# Patient Record
Sex: Female | Born: 1960 | Race: White | Hispanic: No | Marital: Married | Smoking: Never smoker
Health system: Southern US, Community
[De-identification: ages and names within clinical notes are randomized; demographics above are authoritative.]

## PROBLEM LIST (undated history)

## (undated) DIAGNOSIS — M1990 Unspecified osteoarthritis, unspecified site: Secondary | ICD-10-CM

## (undated) DIAGNOSIS — R011 Cardiac murmur, unspecified: Secondary | ICD-10-CM

## (undated) DIAGNOSIS — T7840XA Allergy, unspecified, initial encounter: Secondary | ICD-10-CM

## (undated) HISTORY — DX: Unspecified osteoarthritis, unspecified site: M19.90

## (undated) HISTORY — DX: Allergy, unspecified, initial encounter: T78.40XA

## (undated) HISTORY — DX: Cardiac murmur, unspecified: R01.1

## (undated) HISTORY — PX: KNEE ARTHROSCOPY WITH ANTERIOR CRUCIATE LIGAMENT (ACL) REPAIR: SHX5644

## (undated) HISTORY — PX: KNEE CARTILAGE SURGERY: SHX688

---

## 2001-04-30 ENCOUNTER — Ambulatory Visit (HOSPITAL_COMMUNITY): Admission: RE | Admit: 2001-04-30 | Discharge: 2001-04-30 | Payer: Self-pay | Admitting: Specialist

## 2001-04-30 ENCOUNTER — Encounter: Payer: Self-pay | Admitting: Specialist

## 2001-07-20 ENCOUNTER — Observation Stay (HOSPITAL_COMMUNITY): Admission: RE | Admit: 2001-07-20 | Discharge: 2001-07-21 | Payer: Self-pay | Admitting: Orthopedic Surgery

## 2001-07-20 ENCOUNTER — Encounter: Payer: Self-pay | Admitting: Orthopedic Surgery

## 2001-09-19 ENCOUNTER — Other Ambulatory Visit: Admission: RE | Admit: 2001-09-19 | Discharge: 2001-09-19 | Payer: Self-pay | Admitting: Obstetrics and Gynecology

## 2002-11-18 ENCOUNTER — Other Ambulatory Visit: Admission: RE | Admit: 2002-11-18 | Discharge: 2002-11-18 | Payer: Self-pay | Admitting: Obstetrics and Gynecology

## 2003-01-13 ENCOUNTER — Ambulatory Visit (HOSPITAL_COMMUNITY): Admission: RE | Admit: 2003-01-13 | Discharge: 2003-01-13 | Payer: Self-pay | Admitting: Obstetrics and Gynecology

## 2003-01-13 ENCOUNTER — Encounter: Payer: Self-pay | Admitting: Obstetrics and Gynecology

## 2004-04-29 ENCOUNTER — Ambulatory Visit (HOSPITAL_COMMUNITY): Admission: RE | Admit: 2004-04-29 | Discharge: 2004-04-29 | Payer: Self-pay | Admitting: Family Medicine

## 2006-08-26 ENCOUNTER — Emergency Department (HOSPITAL_COMMUNITY): Admission: EM | Admit: 2006-08-26 | Discharge: 2006-08-26 | Payer: Self-pay | Admitting: Family Medicine

## 2009-12-24 ENCOUNTER — Encounter: Admission: RE | Admit: 2009-12-24 | Discharge: 2009-12-24 | Payer: Self-pay | Admitting: Obstetrics and Gynecology

## 2010-08-30 ENCOUNTER — Ambulatory Visit (HOSPITAL_COMMUNITY): Payer: Self-pay

## 2010-11-12 NOTE — Op Note (Signed)
[facility]  Patient:    Veronica Chavez, Veronica Chavez Visit Number: [id number] MRN: [id number]          Service Type: SUR Location: 4W [id number] 01 Attending Physician:  Verlee Rossetti. Dictated by:   Malon Kindle, M.D. Proc. Date: 07/20/01 Admit Date:  07/20/2001 Discharge Date: 07/21/2001                             Operative Report  PREOPERATIVE DIAGNOSIS:  Left anterior cruciate ligament tear.  POSTOPERATIVE DIAGNOSES:  1. Left anterior cruciate ligament tear.  2. Medial meniscal tear and medial femoral condyle chondromalacia.  PROCEDURE PERFORMED:  Left knee bone ______ bone autograft ACL reconstruction.  ATTENDING SURGEON:  Malon Kindle, M.D.  FIRST ASSISTANT:  Ralene Bathe, P.A.  SECOND ASSISTANT:  Javier Docker, M.D.  ANESTHESIA:  General.  ESTIMATED BLOOD LOSS:  Minimal.  TOURNIQUET TIME:  Two hours.  INSTRUMENT COUNTS:  Correct.  COMPLICATIONS:  None.  Preoperative antibiotics were given. Fluid replacement was 1200 cc crystalloid.  INDICATIONS FOR PROCEDURE:  The patient is a 50 year old female who complained of a noncontact twisting injury to the left knee. The patients knee gave way. The patient complained of swelling and pain in the knee. He presented at orthopedics with an unstable knee and had an MRI scan demonstrating ACL disruption as well as medial meniscal tear and medial femoral condylar cartilage damage and a lateral bone bruise. The patient rehabbed her knee and continued to have functional instability with activities of daily living. After discussion with the patient options of management including nonoperative treatment, physical therapy, activity modifications versus surgical reconstruction of her ACL, she elected to proceed with ACL reconstruction. Informed consent was signed on the chart.  DESCRIPTION OF PROCEDURE:  After an adequate level of general anesthesia was achieved and 1 gm of Ancef was given  preoperatively, the patient was positioned supine on the operating room table. Examination under anesthesia was performed on the left knee. This revealed range of motion from 0 to 140 degrees. She had a 2+ anterior drawer and 2+ Lachman negative posterior drawer, positive pivot shift, stable varus and valgus stress, negative posterolateral rotatory instability. After a completion of the EUA, a nonsterile tourniquet was placed on the proximal thigh, the left leg was then placed in an arthroscopic leg holder. The right leg was placed in a well leg holder. The left leg was exsanguinated using an Esmarch bandage and tourniquet elevated to 275 mmHg. A longitudinal incision was created over the patellar tendon utilizing a 10 blade scalpel going down toward the medial tibial tubercle. Dissection was carried down through the subcutaneous tissues. The peritenon was identified and incised in line with the middle of the patella tendon. A central third of the patella tendon was harvested using a 10 blade scalpel including bone plugs from the patella and the tibia. These were 10 mm bone plugs, 20-25 mm in length. A graft was taken to the back table and prepared and sized to fit in 10 mm tunnels. A patellar plug was 25 mm in length and would be used for the femur. Two #5 Ethibond sutures were placed through drill holes in that plug and the tibial plug was 30 mm in length and ear marked for the tibial plug. The total graft length was 90 mm. After three #5 Ethibonds are placed in the tibial plug, the graft was then placed in a moist sponge and held on the back  table for later in the case. A superolateral outflow portal was created using an 11 blade scalpel and introduction of the cannula in the joint using a blunt obturator and the lateral and anterior medial portals were created through the midline incision utilizing an 11 blade scalpel and hemostat. The knee was scope, there was noted to be  some chondromalacia in the patellofemoral joint which was grade 1 and grade 2. The medial and lateral gutters were free of loose bodies. The lateral compartment was entered and there was noted to be no evidence of meniscal tear or cartilage injury to the femoral condyle or tibial plateau. The ACL was notably torn with a large stump present in the notch. PCL was intact. The medial compartment was entered and there was noted to be a posterior horn medial meniscal tear. This appeared to be a 1 1/2 to 2 cm tear posteriorly. Initially it was felt like this tear was possibly repairable. It was right near the white white red white junction. An attempt was made to repair the meniscus with two meniscal darts by arthrex. These were placed into appropriate position on the femoral side of the meniscus and after placement of the arrows, a probe was used to probe the meniscus and there was noted to be laxity of the meniscus with under surface probing. It was decided at this point because of persistent instability despite appropriate arrow placement to proceed with partial meniscectomy. This was performed. It was noted at the time of partial meniscectomy that this tear pattern was more of a complex pattern with horizontal cleavage component as well as the longitudinal component. The darts were noted to be counter sunk into good meniscal tissue posteriorly and these were left in place. There were no prominent heads visualized. Approximately 15-20% of the meniscus had to be removed during this portion of the procedure. The remainder of the meniscus looked entirely normal. The medial femoral condyle had some grade 2 and grade 3 changes which loose cartilage fraying was removed using a forward edge resector. At this point, soft tissue was removed from the notch and a notchplasty is performed using a motorized bur. The Linvatec tibial guide was then placed in the knee approximately 7 mm anterior to the  posterior cruciate ligament just medial to the midline and a 10 mm tunnel was drilled at 45 degrees starting medial and  distal to the tibial tubercle. The soft tissue was removed from the tunnel and the posterior aspect of the tunnel was rasped. At this point, a 6 mm offset ACL guide by Arthrex was placed in the 1 to 115 position in this left knee. The knee was placed at about 85 degrees and a Beath pin was drilled out the anterior lateral thigh. The position of the Beath pin was verified and a 10 mm acorn drill bit was introduced into the knee and used to drill a tunnel in the femur. The presence of a back wall was verified prior to fully drilling the tunnel. It was drilled to a depth of 30 mm. Soft tissue and loose debris was removed from the knee. An extra graft was placed in antegrade manner using the Beath pin. The anterior aspect of the femoral tunnel notched and an 8 x 25 mm arthrex titanium interference screw was placed adjacent to the graft gaining adequate purchase. The knee was then cycled approximately 20-30 times to verify stability of the femoral plug and to pretension the graft. Excellent isometry was noted during full flexion  extension with absolutely no movement of the tibial plug within the tibial tunnel. At this point, the knee was placed in near full extension, the posterior drawer was applied and tension on the graft and a 9 x 25 mm arthrex titanium interference screw was placed adjacent to cancellous bone on the tibial side. The knee was then examined for stability and there was noted to be negative Lachman and negative anterior drawer. The patellar defect was grafted using available bone graft and then the peritenon was closed using 2-0 running Vicryl. The patellar tendon had been reapproximated in the anterior fibers only using 2-0 Vicryl simple interrupted and then subcu was closed using 2-0 Vicryl and the skin using 4-0 running monocryl. The superolateral portal  was closed using the monocryl as well. A sterile dressing was applied followed by a knee immobilizer. The patient tolerated the surgery well and was taken to the recovery room in stable condition. Dictated by:   Malon Kindle, M.D. Attending Physician:  Malon Kindle R. DD:  07/21/01 TD:  07/21/01 Job: [id number]

## 2012-06-22 ENCOUNTER — Ambulatory Visit (INDEPENDENT_AMBULATORY_CARE_PROVIDER_SITE_OTHER): Payer: BC Managed Care – PPO | Admitting: Family Medicine

## 2012-06-22 VITALS — BP 125/71 | HR 67 | Temp 98.4°F | Resp 16 | Ht 62.0 in | Wt 112.0 lb

## 2012-06-22 DIAGNOSIS — M1990 Unspecified osteoarthritis, unspecified site: Secondary | ICD-10-CM

## 2012-06-22 DIAGNOSIS — M542 Cervicalgia: Secondary | ICD-10-CM

## 2012-06-22 DIAGNOSIS — Z0000 Encounter for general adult medical examination without abnormal findings: Secondary | ICD-10-CM

## 2012-06-22 LAB — POCT CBC
Granulocyte percent: 48.8 %G (ref 37–80)
HCT, POC: 44.4 % (ref 37.7–47.9)
Hemoglobin: 14.5 g/dL (ref 12.2–16.2)
POC Granulocyte: 2.3 (ref 2–6.9)
POC LYMPH PERCENT: 37.3 %L (ref 10–50)
RBC: 4.78 M/uL (ref 4.04–5.48)
RDW, POC: 12.4 %

## 2012-06-22 LAB — LIPID PANEL
LDL Cholesterol: 106 mg/dL — ABNORMAL HIGH (ref 0–99)
Triglycerides: 78 mg/dL (ref <150)

## 2012-06-22 LAB — COMPREHENSIVE METABOLIC PANEL
Albumin: 4.6 g/dL (ref 3.5–5.2)
Alkaline Phosphatase: 56 U/L (ref 39–117)
Chloride: 102 mEq/L (ref 96–112)
Glucose, Bld: 82 mg/dL (ref 70–99)
Potassium: 4.2 mEq/L (ref 3.5–5.3)
Sodium: 139 mEq/L (ref 135–145)
Total Protein: 6.8 g/dL (ref 6.0–8.3)

## 2012-06-22 LAB — TSH: TSH: 1.581 u[IU]/mL (ref 0.350–4.500)

## 2012-06-22 NOTE — Patient Instructions (Addendum)
Get a colonoscopy(51 year old) at [facility] [phone]) or Dr. Loreta Ave or Dr. Elnoria Howard [phone])  Continue following with the GYN for annual breast /pelvic exams  Return if worse

## 2012-06-22 NOTE — Progress Notes (Signed)
Complete physical examination  History: Patient is here for physical exam. She has no major acute medical complaints. She has a regular Dr. but was unable to get an appointment in for her annual physical which is needed by her insurance. It has been a long time since she had a set of lab work done she would like that.  Past medical history. Medical illnesses: History of respiratory allergies, have improved with the years Arthritis, most of the hands and knees History of heart murmur when she was younger History of ADHD  Surgical history: Has had surgery both knees. She had a fracture of the right a an anterior cruciate ligament tear on the left.  Allergies: Codeine  Current medications: Adderall XR 30 mg which she's been on for about 10 years. She is also on some supplements that she takes her.  Family history: Mother is deceased from liver problems. Father deceased from heart problems. Sister deceased from small cell lung cancer. 2 sisters are living one brother is living. One brother is deceased. She has one adopted daughter from [place] who is 34 years old.  Social history: Months sexual partner. She's been with for some time. She does not smoke. Does drink occasional beer. No illicit drug use. 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 She does exercise.  Review of systems: Constitutional unremarkable Cardiovascular unremarkable Respiratory: Unremarkable GI: GU: Unremarkable Musculoskeletal: Has joint pains as noted above. Dermatologic: Unremarkable Neurologic: Numbness to right or neck. She see her primary care about this already. She is interested in seeing a specialist Hematologic unremarkable psychiatric: The ADHD is noted Endocrine as a problems. Sees a gynecologist.  Physical examination: Gen. a well-developed well-nourished female no acute distress. TMs normal. Eyes PERRLA. Fundi  benign. Does say she has lots of floaters. Throat was clear. Neck supple without nodes thyromegaly. No carotid bruits. Chest clear to auscultation. Heart regular without murmurs gallops or arrhythmias. Abdomen soft. No nodes were noted. Extremities unremarkable. Straight leg raise test negative. Has decreased range of motion of the neck, especially side to side tilt and posterior extension. Grip is good. Assessment:  Assessment: Complete physical examination Cervical pain with radiculopathy Osteoarthritis ADHD  Plan: Refer for 51 year old colonoscopy. Refer to Ortho back to evaluate her neck. Check baseline labs

## 2012-06-24 ENCOUNTER — Encounter: Payer: Self-pay | Admitting: *Deleted

## 2013-03-02 ENCOUNTER — Inpatient Hospital Stay (HOSPITAL_COMMUNITY)
Admission: EM | Admit: 2013-03-02 | Discharge: 2013-03-05 | DRG: 482 | Disposition: A | Discharge status: Home Health | Payer: 59 | Attending: Orthopedic Surgery | Admitting: Orthopedic Surgery

## 2013-03-02 ENCOUNTER — Encounter (HOSPITAL_COMMUNITY): Payer: Self-pay | Admitting: *Deleted

## 2013-03-02 ENCOUNTER — Emergency Department (HOSPITAL_COMMUNITY): Payer: 59

## 2013-03-02 DIAGNOSIS — S72002A Fracture of unspecified part of neck of left femur, initial encounter for closed fracture: Secondary | ICD-10-CM

## 2013-03-02 DIAGNOSIS — S7223XA Displaced subtrochanteric fracture of unspecified femur, initial encounter for closed fracture: Principal | ICD-10-CM | POA: Diagnosis present

## 2013-03-02 DIAGNOSIS — Z79899 Other long term (current) drug therapy: Secondary | ICD-10-CM

## 2013-03-02 DIAGNOSIS — W11XXXA Fall on and from ladder, initial encounter: Secondary | ICD-10-CM | POA: Diagnosis present

## 2013-03-02 LAB — CBC WITH DIFFERENTIAL/PLATELET
Basophils Absolute: 0 10*3/uL (ref 0.0–0.1)
Basophils Relative: 0 % (ref 0–1)
Eosinophils Absolute: 0.1 10*3/uL (ref 0.0–0.7)
Eosinophils Relative: 1 % (ref 0–5)
HCT: 35.6 % — ABNORMAL LOW (ref 36.0–46.0)
Hemoglobin: 12 g/dL (ref 12.0–15.0)
Lymphocytes Relative: 7 % — ABNORMAL LOW (ref 12–46)
Lymphs Abs: 0.6 10*3/uL — ABNORMAL LOW (ref 0.7–4.0)
MCH: 29.3 pg (ref 26.0–34.0)
MCHC: 33.7 g/dL (ref 30.0–36.0)
MCV: 86.8 fL (ref 78.0–100.0)
Monocytes Absolute: 0.6 10*3/uL (ref 0.1–1.0)
Monocytes Relative: 7 % (ref 3–12)
Neutro Abs: 7.5 10*3/uL (ref 1.7–7.7)
Neutrophils Relative %: 85 % — ABNORMAL HIGH (ref 43–77)
Platelets: 166 10*3/uL (ref 150–400)
RBC: 4.1 MIL/uL (ref 3.87–5.11)
RDW: 12.2 % (ref 11.5–15.5)
WBC: 8.8 10*3/uL (ref 4.0–10.5)

## 2013-03-02 LAB — URINALYSIS, ROUTINE W REFLEX MICROSCOPIC
Bilirubin Urine: NEGATIVE
Glucose, UA: NEGATIVE mg/dL
Hgb urine dipstick: NEGATIVE
Ketones, ur: NEGATIVE mg/dL
Nitrite: NEGATIVE
Protein, ur: NEGATIVE mg/dL
Specific Gravity, Urine: 1.02 (ref 1.005–1.030)
Urobilinogen, UA: 0.2 mg/dL (ref 0.0–1.0)
pH: 7 (ref 5.0–8.0)

## 2013-03-02 LAB — BASIC METABOLIC PANEL
BUN: 19 mg/dL (ref 6–23)
CO2: 29 mEq/L (ref 19–32)
Calcium: 8.9 mg/dL (ref 8.4–10.5)
Chloride: 103 mEq/L (ref 96–112)
Creatinine, Ser: 0.58 mg/dL (ref 0.50–1.10)
GFR calc Af Amer: >90 mL/min (ref >90)
GFR calc non Af Amer: >90 mL/min (ref >90)
Glucose, Bld: 110 mg/dL — ABNORMAL HIGH (ref 70–99)
Potassium: 3.7 mEq/L (ref 3.5–5.1)
Sodium: 138 mEq/L (ref 135–145)

## 2013-03-02 LAB — ABO/RH: ABO/RH(D): O NEG

## 2013-03-02 LAB — URINE MICROSCOPIC-ADD ON

## 2013-03-02 LAB — TYPE AND SCREEN: ABO/RH(D): O NEG

## 2013-03-02 LAB — PROTIME-INR
INR: 1.09 (ref 0.00–1.49)
INR: 1.1 (ref 0.00–1.49)

## 2013-03-02 MED ORDER — LACTATED RINGERS IV SOLN
INTRAVENOUS | Status: DC
  Administered 2013-03-02 – 2013-03-03 (×2): via INTRAVENOUS

## 2013-03-02 MED ORDER — MORPHINE SULFATE 2 MG/ML IJ SOLN
0.5000 mg | INTRAMUSCULAR | Status: DC | PRN
  Administered 2013-03-02 – 2013-03-03 (×2): 0.5 mg via INTRAVENOUS
  Filled 2013-03-02 (×3): qty 1

## 2013-03-02 MED ORDER — SODIUM CHLORIDE 0.9 % IV SOLN
1000.0000 mL | Freq: Once | INTRAVENOUS | Status: AC
  Administered 2013-03-02: 1000 mL via INTRAVENOUS

## 2013-03-02 MED ORDER — HYDROMORPHONE HCL PF 1 MG/ML IJ SOLN
1.0000 mg | Freq: Once | INTRAMUSCULAR | Status: AC
  Administered 2013-03-02: 1 mg via INTRAVENOUS
  Filled 2013-03-02: qty 1

## 2013-03-02 MED ORDER — NALOXONE HCL 0.4 MG/ML IJ SOLN: 0.4000 mg | INTRAMUSCULAR | Status: DC | PRN

## 2013-03-02 MED ORDER — MORPHINE SULFATE 4 MG/ML IJ SOLN
6.0000 mg | Freq: Once | INTRAMUSCULAR | Status: AC
  Administered 2013-03-02: 6 mg via INTRAVENOUS
  Filled 2013-03-02: qty 2

## 2013-03-02 MED ORDER — SODIUM CHLORIDE 0.9 % IV SOLN
1000.0000 mL | INTRAVENOUS | Status: DC
  Administered 2013-03-02: 1000 mL via INTRAVENOUS

## 2013-03-02 MED ORDER — METHOCARBAMOL 100 MG/ML IJ SOLN
500.0000 mg | Freq: Four times a day (QID) | INTRAVENOUS | Status: DC | PRN
  Administered 2013-03-02 – 2013-03-03 (×2): 500 mg via INTRAVENOUS
  Filled 2013-03-02 (×3): qty 5

## 2013-03-02 MED ORDER — SODIUM CHLORIDE 0.9 % IJ SOLN: 9.0000 mL | INTRAMUSCULAR | Status: DC | PRN

## 2013-03-02 MED ORDER — FENTANYL CITRATE 0.05 MG/ML IJ SOLN
50.0000 ug | Freq: Once | INTRAMUSCULAR | Status: AC
  Administered 2013-03-02: 50 ug via INTRAVENOUS
  Filled 2013-03-02: qty 2

## 2013-03-02 MED ORDER — ONDANSETRON HCL 4 MG/2ML IJ SOLN
4.0000 mg | Freq: Once | INTRAMUSCULAR | Status: AC
  Administered 2013-03-02: 4 mg via INTRAVENOUS
  Filled 2013-03-02: qty 2

## 2013-03-02 MED ORDER — DIPHENHYDRAMINE HCL 12.5 MG/5ML PO ELIX: 12.5000 mg | ORAL_SOLUTION | Freq: Four times a day (QID) | ORAL | Status: DC | PRN

## 2013-03-02 MED ORDER — ONDANSETRON HCL 4 MG/2ML IJ SOLN
4.0000 mg | Freq: Four times a day (QID) | INTRAMUSCULAR | Status: DC | PRN
  Administered 2013-03-03 (×2): 4 mg via INTRAVENOUS
  Filled 2013-03-02 (×2): qty 2

## 2013-03-02 MED ORDER — METHOCARBAMOL 500 MG PO TABS
500.0000 mg | ORAL_TABLET | Freq: Four times a day (QID) | ORAL | Status: DC | PRN
  Administered 2013-03-04 – 2013-03-05 (×2): 500 mg via ORAL
  Filled 2013-03-02 (×2): qty 1

## 2013-03-02 MED ORDER — HYDROCODONE-ACETAMINOPHEN 5-325 MG PO TABS: 1.0000 | ORAL_TABLET | Freq: Four times a day (QID) | ORAL | Status: DC | PRN

## 2013-03-02 MED ORDER — ENOXAPARIN SODIUM 40 MG/0.4ML ~~LOC~~ SOLN
40.0000 mg | SUBCUTANEOUS | Status: DC
  Filled 2013-03-02 (×2): qty 0.4

## 2013-03-02 MED ORDER — FENTANYL 10 MCG/ML IV SOLN
INTRAVENOUS | Status: DC
  Filled 2013-03-02: qty 50

## 2013-03-02 MED ORDER — DIPHENHYDRAMINE HCL 50 MG/ML IJ SOLN: 12.5000 mg | Freq: Four times a day (QID) | INTRAMUSCULAR | Status: DC | PRN

## 2013-03-02 MED ORDER — AMPHETAMINE-DEXTROAMPHET ER 10 MG PO CP24
30.0000 mg | ORAL_CAPSULE | Freq: Every day | ORAL | Status: DC
  Administered 2013-03-05: 10 mg via ORAL
  Filled 2013-03-02: qty 3

## 2013-03-02 MED ORDER — CEFAZOLIN SODIUM-DEXTROSE 2-3 GM-% IV SOLR
2.0000 g | INTRAVENOUS | Status: AC
  Administered 2013-03-03: 2 g via INTRAVENOUS
  Filled 2013-03-02: qty 50

## 2013-03-02 NOTE — ED Provider Notes (Addendum)
CSN: [id number]     Arrival date & time 03/02/13  1415 History   First MD Initiated Contact with Patient 03/02/13 [phone]     Chief Complaint  Patient presents with  . Hip Injury   (Consider location/radiation/quality/duration/timing/severity/associated sxs/prior Treatment) The history is provided by the patient.   patient reports she was stepping down on the latter this afternoon when she tripped and fell on her left hip.  She reports moderate to severe pain in her left hip.  She received 100 mcg of fentanyl and route by EMS and still continues to have moderate pain at this time.  No numbness or tingling.  She denies head injury.  No neck pain.  No weakness of her upper lower extremities.  No abdominal pain or chest pain.  No other significant medical problems.  Last time she ate or drank anything was approximately 1:30 PM.  No other complaints.  Her pain is worsened by movement and palpation of her left hip.  Past Medical History  Diagnosis Date  . Allergy   . Arthritis   . Heart murmur    Past Surgical History  Procedure Laterality Date  . Knee arthroscopy with anterior cruciate ligament (acl) repair    . Knee cartilage surgery     Family History  Problem Relation Age of Onset  . Liver disease Mother   . Heart disease Father   . Cancer Sister   . Heart disease Paternal Grandfather    History  Substance Use Topics  . Smoking status: Never Smoker   . Smokeless tobacco: Not on file  . Alcohol Use: Yes   OB History   Grav Para Term Preterm Abortions TAB SAB Ect Mult Living                 Review of Systems  All other systems reviewed and are negative.    Allergies  Codeine  Home Medications   Current Outpatient Rx  Name  Route  Sig  Dispense  Refill  . amphetamine-dextroamphetamine (ADDERALL XR) 30 MG 24 hr capsule   Oral   Take 30 mg by mouth every morning.         Marland Kitchen Hormone Lotion Base LOTN   Does not apply   1 application by Does not apply route daily.         BP 144/68  Pulse 65  Resp 18  SpO2 100% Physical Exam  Nursing note and vitals reviewed. Constitutional: She is oriented to person, place, and time. She appears well-developed and well-nourished. No distress.  HENT:  Head: Normocephalic and atraumatic.  Eyes: EOM are normal.  Neck: Normal range of motion.  Cardiovascular: Normal rate, regular rhythm and normal heart sounds.   Pulmonary/Chest: Effort normal and breath sounds normal.  Abdominal: Soft. She exhibits no distension. There is no tenderness.  Musculoskeletal:  Patient with pain with range of motion of left hip and obvious deformity of left hip with mild shortening.  We will status.  Normal pulses in left foot.  Neurological: She is alert and oriented to person, place, and time.  Skin: Skin is warm and dry.  Psychiatric: She has a normal mood and affect. Judgment normal.    ED Course  Procedures (including critical care time)  Date: 03/02/2013  Rate: 67  Rhythm: normal sinus rhythm  QRS Axis: normal  Intervals: normal  ST/T Wave abnormalities: normal  Conduction Disutrbances: none  Narrative Interpretation:   Old EKG Reviewed: No significant changes noted  Labs Review Labs Reviewed  BASIC METABOLIC PANEL - Abnormal; Notable for the following:    Glucose, Bld 110 (*)    All other components within normal limits  CBC WITH DIFFERENTIAL - Abnormal; Notable for the following:    HCT 35.6 (*)    Neutrophils Relative % 85 (*)    Lymphocytes Relative 7 (*)    Lymphs Abs 0.6 (*)    All other components within normal limits  URINALYSIS, ROUTINE W REFLEX MICROSCOPIC - Abnormal; Notable for the following:    Leukocytes, UA TRACE (*)    All other components within normal limits  PROTIME-INR  URINE MICROSCOPIC-ADD ON  PROTIME-INR  TYPE AND SCREEN  ABO/RH   Imaging Review Dg Hip Complete Left  03/02/2013   *RADIOLOGY REPORT*  Clinical Data: Post fall, now with left hip pain  LEFT HIP - COMPLETE 2+ VIEW   Comparison: None.  Findings:  Examination is degraded secondary to patient's overlying hands and coins within her bilateral pockets.  There is a comminuted, minimally displaced fracture primarily involving the proximal diaphysis of the femur with extension to involve the lesser trochanter.  There is approximately 5 cm of foreshortening with associated mild varus angulation.  No definite dislocation.  Limited visualization of the adjacent pelvis is normal given limitations of the examination.  No definite radiopaque foreign body.  IMPRESSION: Comminuted, minimally displaced fracture primarily involving the proximal diaphysis of the femur with extension to involve the lesser trochanter.   Original Report Authenticated By: Tacey Ruiz, MD   Dg Chest Port 1 View  03/02/2013   *RADIOLOGY REPORT*  Clinical Data: Preop for hip fracture.  PORTABLE CHEST - 1 VIEW  Comparison: 08/26/2006  Findings: Numerous leads and wires project over the chest.  Midline trachea.  Normal heart size and mediastinal contours. No pleural effusion or pneumothorax.  Diffuse peribronchial thickening.  Clear lungs.  IMPRESSION:  1. No acute cardiopulmonary disease. 2.  Interstitial thickening.  The clinical history states the patient is a nonsmoker.  Therefore, this could represent sequelae of asthma or chronic bronchitis.   Original Report Authenticated By: Jeronimo Greaves, M.D.   I personally reviewed the imaging tests through PACS system I reviewed available ER/hospitalization records through the EMR   MDM   1. Fracture of proximal end of femur, left, closed, initial encounter    Left subtrochanteric hip fracture.  Orthopedic consultation.  [facility] hospitalist admission.    Lyanne Co, MD 03/02/13 1642  Lyanne Co, MD 03/02/13 [phone]

## 2013-03-02 NOTE — Progress Notes (Signed)
Orthopedic Tech Progress Note Patient Details:  Veronica Chavez September 27, 1960 [id number]  Musculoskeletal Traction Type of Traction: Bucks Skin Traction Traction Location: LLE Traction Weight: 10 lbs    Jennye Moccasin 03/02/2013, 10:32 PM

## 2013-03-02 NOTE — ED Notes (Signed)
Per ems: pt was on a ladder, fell backwards about 3 feet. C/o left hip pain. Denies LOC. fentanyl given in route, pain down to 5/10. bp 127/58, pulse 70, saO2 98%ra

## 2013-03-02 NOTE — ED Notes (Signed)
Bed: [facility] Expected date: 03/02/13 Expected time: 2:19 PM Means of arrival: Ambulance Comments: fall

## 2013-03-02 NOTE — H&P (Signed)
No primary provider on file. Chief Complaint: Let femur fracture History: The history is provided by the Veronica Chavez.  Veronica Chavez reports she was stepping down on the latter this afternoon when she tripped and fell on her left hip. She reports moderate to severe pain in her left hip. She received 100 mcg of fentanyl and route by EMS and still continues to have moderate pain at this time. No numbness or tingling. She denies head injury. No neck pain. No weakness of her upper lower extremities. No abdominal pain or chest pain. No other significant medical problems. Last time she ate or drank anything was approximately 1:30 PM. No other complaints. Her pain is worsened by movement and palpation of her left hip.  Past Medical History  Diagnosis Date  . Allergy   . Arthritis   . Heart murmur     Allergies  Allergen Reactions  . Codeine Hives and Nausea Only    No current facility-administered medications on file prior to encounter.   Current Outpatient Prescriptions on File Prior to Encounter  Medication Sig Dispense Refill  . amphetamine-dextroamphetamine (ADDERALL XR) 30 MG 24 hr capsule Take 30 mg by mouth every morning.      Marland Kitchen Hormone Lotion Base LOTN 1 application by Does not apply route daily.        Physical Exam: Filed Vitals:   03/02/13 1426  BP: 144/68  Pulse: 65  Resp: 18  A+O X3 NVI - EHL/TA/GA intact. Sensation to LT intact  Compartments soft/NT No SOB/CP Ad soft/NT No abrasion/contusion    Image: Dg Hip Complete Left  03/02/2013   *RADIOLOGY REPORT*  Clinical Data: Post fall, now with left hip pain  LEFT HIP - COMPLETE 2+ VIEW  Comparison: None.  Findings:  Examination is degraded secondary to Veronica Chavez's overlying hands and coins within her bilateral pockets.  There is a comminuted, minimally displaced fracture primarily involving the proximal diaphysis of the femur with extension to involve the lesser trochanter.  There is approximately 5 cm of foreshortening with  associated mild varus angulation.  No definite dislocation.  Limited visualization of the adjacent pelvis is normal given limitations of the examination.  No definite radiopaque foreign body.  IMPRESSION: Comminuted, minimally displaced fracture primarily involving the proximal diaphysis of the femur with extension to involve the lesser trochanter.   Original Report Authenticated By: Tacey Ruiz, MD   Dg Chest Port 1 View  03/02/2013   *RADIOLOGY REPORT*  Clinical Data: Preop for hip fracture.  PORTABLE CHEST - 1 VIEW  Comparison: 08/26/2006  Findings: Numerous leads and wires project over the chest.  Midline trachea.  Normal heart size and mediastinal contours. No pleural effusion or pneumothorax.  Diffuse peribronchial thickening.  Clear lungs.  IMPRESSION:  1. No acute cardiopulmonary disease. 2.  Interstitial thickening.  The clinical history states the Veronica Chavez is a nonsmoker.  Therefore, this could represent sequelae of asthma or chronic bronchitis.   Original Report Authenticated By: Jeronimo Greaves, M.D.    A/P:  Pleasant female s/p fall from 72ft ladder with deformity of left femur.  Xrayus demonstrate subtroch femur fracture.  Spoke with Dr Ranell Patrick.  Will transfer to [facility] for definitive fracture management tomorrow afternoon.  PCA for pain tonight.  PRe-op labs, EKG, CXR to be completed.

## 2013-03-02 NOTE — ED Notes (Signed)
Pt in bed, appear to be in no distress, has swelling to rt upper extremity. Alert and oriented x 4. Family at bedside. Awaiting transport from [facility], RF ice pack.

## 2013-03-02 NOTE — ED Notes (Signed)
MD at bedside. 

## 2013-03-03 ENCOUNTER — Inpatient Hospital Stay (HOSPITAL_COMMUNITY): Payer: 59

## 2013-03-03 ENCOUNTER — Encounter (HOSPITAL_COMMUNITY): Payer: Self-pay | Admitting: Anesthesiology

## 2013-03-03 ENCOUNTER — Encounter (HOSPITAL_COMMUNITY)
Admission: EM | Disposition: A | Discharge status: Home Health | Payer: Self-pay | Source: Home / Self Care | Attending: Internal Medicine

## 2013-03-03 ENCOUNTER — Inpatient Hospital Stay (HOSPITAL_COMMUNITY): Payer: 59 | Admitting: Anesthesiology

## 2013-03-03 ENCOUNTER — Encounter (HOSPITAL_COMMUNITY): Payer: Self-pay | Admitting: *Deleted

## 2013-03-03 HISTORY — PX: FEMUR IM NAIL: SHX1597

## 2013-03-03 LAB — CBC
HCT: 32.7 % — ABNORMAL LOW (ref 36.0–46.0)
HCT: 31.5 % — ABNORMAL LOW (ref 36.0–46.0)
Hemoglobin: 11 g/dL — ABNORMAL LOW (ref 12.0–15.0)
MCH: 30.1 pg (ref 26.0–34.0)
MCHC: 34.9 g/dL (ref 30.0–36.0)
MCV: 87.2 fL (ref 78.0–100.0)
RDW: 12.4 % (ref 11.5–15.5)
RDW: 12.1 % (ref 11.5–15.5)
WBC: 7 10*3/uL (ref 4.0–10.5)

## 2013-03-03 LAB — BASIC METABOLIC PANEL
BUN: 11 mg/dL (ref 6–23)
CO2: 25 mEq/L (ref 19–32)
Chloride: 100 mEq/L (ref 96–112)
Creatinine, Ser: 0.42 mg/dL — ABNORMAL LOW (ref 0.50–1.10)

## 2013-03-03 LAB — CREATININE, SERUM: GFR calc Af Amer: >90 mL/min (ref >90)

## 2013-03-03 SURGERY — INSERTION, INTRAMEDULLARY ROD, FEMUR
Anesthesia: General | Site: Leg Upper | Laterality: Left | Wound class: Clean

## 2013-03-03 MED ORDER — ENOXAPARIN SODIUM 40 MG/0.4ML ~~LOC~~ SOLN: 40.0000 mg | SUBCUTANEOUS | Status: DC

## 2013-03-03 MED ORDER — CEFAZOLIN SODIUM-DEXTROSE 2-3 GM-% IV SOLR
2.0000 g | Freq: Three times a day (TID) | INTRAVENOUS | Status: AC
  Administered 2013-03-03 – 2013-03-04 (×3): 2 g via INTRAVENOUS
  Filled 2013-03-03 (×3): qty 50

## 2013-03-03 MED ORDER — ENOXAPARIN SODIUM 40 MG/0.4ML ~~LOC~~ SOLN
40.0000 mg | SUBCUTANEOUS | Status: DC
  Administered 2013-03-04 – 2013-03-05 (×2): 40 mg via SUBCUTANEOUS
  Filled 2013-03-03 (×3): qty 0.4

## 2013-03-03 MED ORDER — OXYCODONE HCL 5 MG/5ML PO SOLN: 5.0000 mg | Freq: Once | ORAL | Status: DC | PRN

## 2013-03-03 MED ORDER — PHENOL 1.4 % MT LIQD: 1.0000 | OROMUCOSAL | Status: DC | PRN

## 2013-03-03 MED ORDER — ACETAMINOPHEN 650 MG RE SUPP: 650.0000 mg | Freq: Four times a day (QID) | RECTAL | Status: DC | PRN

## 2013-03-03 MED ORDER — ONDANSETRON HCL 4 MG/2ML IJ SOLN
INTRAMUSCULAR | Status: DC | PRN
  Administered 2013-03-03: 4 mg via INTRAVENOUS

## 2013-03-03 MED ORDER — POTASSIUM CHLORIDE IN NACL 20-0.9 MEQ/L-% IV SOLN
INTRAVENOUS | Status: DC
  Administered 2013-03-03: 18:00:00 via INTRAVENOUS
  Filled 2013-03-03 (×5): qty 1000

## 2013-03-03 MED ORDER — PROMETHAZINE HCL 25 MG/ML IJ SOLN: 6.2500 mg | INTRAMUSCULAR | Status: DC | PRN

## 2013-03-03 MED ORDER — HYDROMORPHONE HCL PF 1 MG/ML IJ SOLN
INTRAMUSCULAR | Status: AC
  Administered 2013-03-03: 0.5 mg via INTRAVENOUS
  Filled 2013-03-03: qty 1

## 2013-03-03 MED ORDER — METOCLOPRAMIDE HCL 5 MG/ML IJ SOLN
5.0000 mg | Freq: Three times a day (TID) | INTRAMUSCULAR | Status: DC | PRN
  Administered 2013-03-05: 10 mg via INTRAVENOUS
  Filled 2013-03-03 (×2): qty 2

## 2013-03-03 MED ORDER — MIDAZOLAM HCL 5 MG/5ML IJ SOLN
INTRAMUSCULAR | Status: DC | PRN
  Administered 2013-03-03: 1 mg via INTRAVENOUS

## 2013-03-03 MED ORDER — HYDROMORPHONE HCL PF 1 MG/ML IJ SOLN
0.1000 mg | INTRAMUSCULAR | Status: DC | PRN
  Filled 2013-03-03: qty 1

## 2013-03-03 MED ORDER — BISACODYL 10 MG RE SUPP: 10.0000 mg | Freq: Every day | RECTAL | Status: DC | PRN

## 2013-03-03 MED ORDER — ACETAMINOPHEN 325 MG PO TABS: 650.0000 mg | ORAL_TABLET | Freq: Four times a day (QID) | ORAL | Status: DC | PRN

## 2013-03-03 MED ORDER — ROCURONIUM BROMIDE 100 MG/10ML IV SOLN
INTRAVENOUS | Status: DC | PRN
  Administered 2013-03-03: 40 mg via INTRAVENOUS

## 2013-03-03 MED ORDER — OXYCODONE HCL 5 MG PO TABS: 5.0000 mg | ORAL_TABLET | Freq: Once | ORAL | Status: DC | PRN

## 2013-03-03 MED ORDER — 0.9 % SODIUM CHLORIDE (POUR BTL) OPTIME
TOPICAL | Status: DC | PRN
  Administered 2013-03-03: 1000 mL

## 2013-03-03 MED ORDER — PROPOFOL 10 MG/ML IV BOLUS
INTRAVENOUS | Status: DC | PRN
  Administered 2013-03-03: 150 mg via INTRAVENOUS

## 2013-03-03 MED ORDER — HYDROMORPHONE HCL PF 1 MG/ML IJ SOLN: 1.0000 mg | INTRAMUSCULAR | Status: DC | PRN

## 2013-03-03 MED ORDER — ONDANSETRON HCL 4 MG PO TABS: 4.0000 mg | ORAL_TABLET | Freq: Four times a day (QID) | ORAL | Status: DC | PRN

## 2013-03-03 MED ORDER — FERROUS SULFATE 325 (65 FE) MG PO TABS
325.0000 mg | ORAL_TABLET | Freq: Three times a day (TID) | ORAL | Status: DC
  Administered 2013-03-03 – 2013-03-05 (×5): 325 mg via ORAL
  Filled 2013-03-03 (×8): qty 1

## 2013-03-03 MED ORDER — METOCLOPRAMIDE HCL 10 MG PO TABS: 5.0000 mg | ORAL_TABLET | Freq: Three times a day (TID) | ORAL | Status: DC | PRN

## 2013-03-03 MED ORDER — ARTIFICIAL TEARS OP OINT
TOPICAL_OINTMENT | OPHTHALMIC | Status: DC | PRN
  Administered 2013-03-03: 1 via OPHTHALMIC

## 2013-03-03 MED ORDER — FENTANYL CITRATE 0.05 MG/ML IJ SOLN
INTRAMUSCULAR | Status: DC | PRN
  Administered 2013-03-03 (×3): 50 ug via INTRAVENOUS

## 2013-03-03 MED ORDER — DEXAMETHASONE SODIUM PHOSPHATE 4 MG/ML IJ SOLN
INTRAMUSCULAR | Status: DC | PRN
  Administered 2013-03-03: 8 mg via INTRAVENOUS

## 2013-03-03 MED ORDER — OXYCODONE HCL 5 MG PO TABS
5.0000 mg | ORAL_TABLET | ORAL | Status: DC | PRN
  Administered 2013-03-04: 10 mg via ORAL
  Administered 2013-03-04: 5 mg via ORAL
  Administered 2013-03-04: 10 mg via ORAL
  Administered 2013-03-04: 5 mg via ORAL
  Administered 2013-03-04: 10 mg via ORAL
  Administered 2013-03-05: 5 mg via ORAL
  Administered 2013-03-05: 10 mg via ORAL
  Filled 2013-03-03: qty 2
  Filled 2013-03-03 (×2): qty 1
  Filled 2013-03-03 (×2): qty 2
  Filled 2013-03-03 (×2): qty 1
  Filled 2013-03-03: qty 2

## 2013-03-03 MED ORDER — MENTHOL 3 MG MT LOZG: 1.0000 | LOZENGE | OROMUCOSAL | Status: DC | PRN

## 2013-03-03 MED ORDER — LACTATED RINGERS IV SOLN
INTRAVENOUS | Status: DC | PRN
  Administered 2013-03-03 (×2): via INTRAVENOUS

## 2013-03-03 MED ORDER — KETOROLAC TROMETHAMINE 30 MG/ML IJ SOLN
INTRAMUSCULAR | Status: DC | PRN
  Administered 2013-03-03: 30 mg via INTRAVENOUS

## 2013-03-03 MED ORDER — DEXTROSE 5 % IV SOLN: 500.0000 mg | Freq: Four times a day (QID) | INTRAVENOUS | Status: DC | PRN

## 2013-03-03 MED ORDER — MORPHINE SULFATE 2 MG/ML IJ SOLN
0.5000 mg | Freq: Once | INTRAMUSCULAR | Status: DC
  Filled 2013-03-03: qty 1

## 2013-03-03 MED ORDER — ONDANSETRON HCL 4 MG/2ML IJ SOLN
4.0000 mg | Freq: Four times a day (QID) | INTRAMUSCULAR | Status: DC | PRN
  Administered 2013-03-04 – 2013-03-05 (×2): 4 mg via INTRAVENOUS
  Filled 2013-03-03 (×2): qty 2

## 2013-03-03 MED ORDER — METHOCARBAMOL 500 MG PO TABS: 500.0000 mg | ORAL_TABLET | Freq: Four times a day (QID) | ORAL | Status: DC | PRN

## 2013-03-03 MED ORDER — HYDROMORPHONE HCL PF 1 MG/ML IJ SOLN
0.2500 mg | INTRAMUSCULAR | Status: DC | PRN
  Administered 2013-03-03 (×2): 0.5 mg via INTRAVENOUS

## 2013-03-03 SURGICAL SUPPLY — 51 items
BANDAGE ELASTIC 4 VELCRO ST LF (GAUZE/BANDAGES/DRESSINGS) ×2 IMPLANT
BANDAGE ELASTIC 6 VELCRO ST LF (GAUZE/BANDAGES/DRESSINGS) ×2 IMPLANT
BIT DRILL 4.3MMS DISTAL GRDTED (BIT) IMPLANT
CANISTER SUCTION 2500CC (MISCELLANEOUS) ×1 IMPLANT
CLOTH BEACON ORANGE TIMEOUT ST (SAFETY) ×2 IMPLANT
COVER MAYO STAND STRL (DRAPES) ×1 IMPLANT
COVER SURGICAL LIGHT HANDLE (MISCELLANEOUS) ×2 IMPLANT
DRAPE C-ARM 42X72 X-RAY (DRAPES) IMPLANT
DRAPE INCISE IOBAN 66X45 STRL (DRAPES) IMPLANT
DRAPE ORTHO SPLIT 77X108 STRL (DRAPES) ×4
DRAPE PROXIMA HALF (DRAPES) ×4 IMPLANT
DRAPE STERI IOBAN 125X83 (DRAPES) IMPLANT
DRAPE SURG ORHT 6 SPLT 77X108 (DRAPES) ×2 IMPLANT
DRAPE U-SHAPE 47X51 STRL (DRAPES) ×2 IMPLANT
DRILL 4.3MMS DISTAL GRADUATED (BIT) ×2
DRSG ADAPTIC 3X8 NADH LF (GAUZE/BANDAGES/DRESSINGS) ×2 IMPLANT
DRSG MEPILEX BORDER 4X4 (GAUZE/BANDAGES/DRESSINGS) ×2 IMPLANT
DRSG MEPILEX BORDER 4X8 (GAUZE/BANDAGES/DRESSINGS) ×2 IMPLANT
DRSG PAD ABDOMINAL 8X10 ST (GAUZE/BANDAGES/DRESSINGS) ×4 IMPLANT
DURAPREP 26ML APPLICATOR (WOUND CARE) ×2 IMPLANT
ELECT REM PT RETURN 9FT ADLT (ELECTROSURGICAL) ×2
ELECTRODE REM PT RTRN 9FT ADLT (ELECTROSURGICAL) ×1 IMPLANT
GLOVE BIOGEL PI ORTHO PRO 7.5 (GLOVE) ×1
GLOVE BIOGEL PI ORTHO PRO SZ8 (GLOVE) ×1
GLOVE ORTHO TXT STRL SZ7.5 (GLOVE) ×2 IMPLANT
GLOVE PI ORTHO PRO STRL 7.5 (GLOVE) ×1 IMPLANT
GLOVE PI ORTHO PRO STRL SZ8 (GLOVE) ×1 IMPLANT
GLOVE SURG ORTHO 8.5 STRL (GLOVE) ×2 IMPLANT
GOWN STRL NON-REIN LRG LVL3 (GOWN DISPOSABLE) ×6 IMPLANT
GOWN STRL REIN XL XLG (GOWN DISPOSABLE) ×4 IMPLANT
GUIDEPIN 3.2X17.5 THRD DISP (PIN) ×2 IMPLANT
GUIDEWIRE BALL NOSE 80CM (WIRE) ×2 IMPLANT
KIT BASIN OR (CUSTOM PROCEDURE TRAY) ×2 IMPLANT
KIT ROOM TURNOVER OR (KITS) ×2 IMPLANT
MANIFOLD NEPTUNE II (INSTRUMENTS) ×1 IMPLANT
NS IRRIG 1000ML POUR BTL (IV SOLUTION) ×2 IMPLANT
PACK GENERAL/GYN (CUSTOM PROCEDURE TRAY) ×2 IMPLANT
PAD ARMBOARD 7.5X6 YLW CONV (MISCELLANEOUS) ×5 IMPLANT
PAD CAST 4YDX4 CTTN HI CHSV (CAST SUPPLIES) ×1 IMPLANT
PADDING CAST COTTON 4X4 STRL (CAST SUPPLIES) ×2
SCREW BONE CORTICAL 5.0X38 (Screw) ×1 IMPLANT
SCREW BONE CORTICAL 5.0X40 (Screw) ×1 IMPLANT
SCREW LAG HIP NAIL 10.5X95 (Screw) ×1 IMPLANT
SCREW LAG HIP NAIL 11X320 (Screw) ×1 IMPLANT
SCREWDRIVER HEX TIP 3.5MM (MISCELLANEOUS) ×1 IMPLANT
STAPLER VISISTAT 35W (STAPLE) IMPLANT
SUT ETHILON 4 0 PS 2 18 (SUTURE) IMPLANT
SUT VIC AB 0 CTB1 27 (SUTURE) IMPLANT
SUT VIC AB 2-0 CT1 27 (SUTURE)
SUT VIC AB 2-0 CT1 TAPERPNT 27 (SUTURE) IMPLANT
WATER STERILE IRR 1000ML POUR (IV SOLUTION) ×4 IMPLANT

## 2013-03-03 NOTE — Anesthesia Preprocedure Evaluation (Addendum)
Anesthesia Evaluation  Patient identified by MRN, date of birth, ID band Patient awake    Reviewed: Allergy & Precautions, H&P , NPO status , Patient's Chart, lab work & pertinent test results  History of Anesthesia Complications (+) PONV  Airway Mallampati: II TM Distance: >3 FB Neck ROM: Full    Dental  (+) Teeth Intact   Pulmonary  breath sounds clear to auscultation        Cardiovascular negative cardio ROS  + Valvular Problems/Murmurs Rhythm:Regular Rate:Normal     Neuro/Psych    GI/Hepatic negative GI ROS, Neg liver ROS,   Endo/Other  negative endocrine ROS  Renal/GU negative Renal ROS     Musculoskeletal  (+) Arthritis -,   Abdominal   Peds  Hematology  (+) Blood dyscrasia, anemia ,   Anesthesia Other Findings   Reproductive/Obstetrics                          Anesthesia Physical Anesthesia Plan  ASA: II  Anesthesia Plan: General   Post-op Pain Management:    Induction: Intravenous and Rapid sequence  Airway Management Planned: Oral ETT  Additional Equipment:   Intra-op Plan:   Post-operative Plan: Extubation in OR  Informed Consent: I have reviewed the patients History and Physical, chart, labs and discussed the procedure including the risks, benefits and alternatives for the proposed anesthesia with the patient or authorized representative who has indicated his/her understanding and acceptance.   Dental advisory given  Plan Discussed with: CRNA, Anesthesiologist and Surgeon  Anesthesia Plan Comments:         Anesthesia Quick Evaluation

## 2013-03-03 NOTE — Preoperative (Signed)
Beta Blockers   Reason not to administer Beta Blockers:Not Applicable 

## 2013-03-03 NOTE — Transfer of Care (Signed)
Immediate Anesthesia Transfer of Care Note  Patient: Veronica Chavez  Procedure(s) Performed: Procedure(s): INTRAMEDULLARY (IM) NAIL FEMORAL (Left)  Patient Location: PACU  Anesthesia Type:General  Level of Consciousness: sedated  Airway & Oxygen Therapy: Patient Spontanous Breathing and Patient connected to nasal cannula oxygen  Post-op Assessment: Report given to PACU RN and Post -op Vital signs reviewed and stable  Post vital signs: Reviewed and stable  Complications: No apparent anesthesia complications

## 2013-03-03 NOTE — Progress Notes (Signed)
Orthopedic Tech Progress Note Patient Details:  Veronica Chavez 1960/11/16 [id number]  Patient ID: Veronica Chavez, female   DOB: [birth year], 52 y.o.   MRN: [id number]   Shawnie Pons 03/03/2013, 4:47 PMTrapeze bar.

## 2013-03-03 NOTE — Progress Notes (Signed)
   Subjective: Day of Surgery Procedure(s) (LRB): INTRAMEDULLARY (IM) NAIL FEMORAL (Left) Patient reports pain as moderate.   Patient seen in rounds with Dr. Darrelyn Hillock. Patient is scheduled for surgery with Dr.Norris this afternoon. She reports that for right now her pain is controlled. No chest pain or SOB.    Objective: Vital signs in last 24 hours: Temp:  [97.7 F (36.5 C)-98.4 F (36.9 C)] 98.4 F (36.9 C) (09/07 0615) Pulse Rate:  [65-78] 65 (09/07 0615) Resp:  [15-18] 18 (09/07 0615) BP: (115-144)/(62-70) 122/65 mmHg (09/07 0615) SpO2:  [91 %-100 %] 98 % (09/07 0615) Weight:  [50.803 kg (112 lb)] 50.803 kg (112 lb) (09/06 2335)  Intake/Output from previous day:  Intake/Output Summary (Last 24 hours) at 03/03/13 0826 Last data filed at 03/03/13 0616  Gross per 24 hour  Intake 2792.5 ml  Output   1301 ml  Net 1491.5 ml   Labs:  Recent Labs  03/02/13 1537 03/03/13 0550  HGB 12.0 11.3*    Recent Labs  03/02/13 1537 03/03/13 0550  WBC 8.8 7.0  RBC 4.10 3.75*  HCT 35.6* 32.7*  PLT 166 167    Recent Labs  03/02/13 1537 03/03/13 0550  NA 138 134*  K 3.7 3.7  CL 103 100  CO2 29 25  BUN 19 11  CREATININE 0.58 0.42*  GLUCOSE 110* 109*  CALCIUM 8.9 8.3*    Recent Labs  03/02/13 1537 03/02/13 2033  INR 1.09 1.10    EXAM General - Patient is Alert and Oriented Extremity - Neurologically intact Dorsiflexion/Plantar flexion intact   Past Medical History  Diagnosis Date  . Allergy   . Arthritis   . Heart murmur     Assessment/Plan: Day of Surgery Procedure(s) (LRB): INTRAMEDULLARY (IM) NAIL FEMORAL (Left) scheduled for this afternoon Left hip fracture  Estimated body mass index is 20.48 kg/(m^2) as calculated from the following:   Height as of this encounter: 5\' 2"  (1.575 m).   Weight as of this encounter: 50.803 kg (112 lb).   Will keep NPO. NWB left LE. Dr. Ranell Patrick will resume care this afternoon and moving forward.   Veronica Chavez  Veronica Chavez 03/03/2013, 8:26 AM

## 2013-03-03 NOTE — Brief Op Note (Signed)
03/02/2013 - 03/03/2013  3:16 PM  PATIENT:  Marcene Duos  52 y.o. female  PRE-OPERATIVE DIAGNOSIS:  left femur fracture, displaced subtrochanteric fx  POST-OPERATIVE DIAGNOSIS:  Left femur fracture, displaced subtrochanteric fx  PROCEDURE:  Procedure(s): INTRAMEDULLARY (IM) NAIL FEMORAL (Left), Biomet Affixis  SURGEON:  Surgeon(s) and Role:    * Verlee Rossetti, MD - Primary  PHYSICIAN ASSISTANT:   ASSISTANTS: none   ANESTHESIA:   general  EBL:  Total I/O In: 1500 [I.V.:1500] Out: 1050 [Urine:850; Blood:200]  BLOOD ADMINISTERED:none  DRAINS: none   LOCAL MEDICATIONS USED:  NONE  SPECIMEN:  No Specimen  DISPOSITION OF SPECIMEN:  N/A  COUNTS:  YES  TOURNIQUET:  * No tourniquets in log *  DICTATION: .Other Dictation: Dictation Number [id number]  PLAN OF CARE: Admit to inpatient   PATIENT DISPOSITION:  PACU - hemodynamically stable.   Delay start of Pharmacological VTE agent (>24hrs) due to surgical blood loss or risk of bleeding: no

## 2013-03-03 NOTE — Anesthesia Postprocedure Evaluation (Signed)
  Anesthesia Post-op Note  Patient: Veronica Chavez  Procedure(s) Performed: Procedure(s): INTRAMEDULLARY (IM) NAIL FEMORAL (Left)  Patient Location: PACU  Anesthesia Type:General  Level of Consciousness: awake  Airway and Oxygen Therapy: Patient Spontanous Breathing  Post-op Pain: mild  Post-op Assessment: Post-op Vital signs reviewed  Post-op Vital Signs: stable  Complications: No apparent anesthesia complications

## 2013-03-04 ENCOUNTER — Encounter (HOSPITAL_COMMUNITY): Payer: Self-pay | Admitting: Orthopedic Surgery

## 2013-03-04 LAB — CBC
HCT: 27.1 % — ABNORMAL LOW (ref 36.0–46.0)
Hemoglobin: 9.2 g/dL — ABNORMAL LOW (ref 12.0–15.0)
MCHC: 33.9 g/dL (ref 30.0–36.0)
MCV: 86.3 fL (ref 78.0–100.0)
RDW: 12.4 % (ref 11.5–15.5)

## 2013-03-04 LAB — BASIC METABOLIC PANEL
BUN: 7 mg/dL (ref 6–23)
Creatinine, Ser: 0.46 mg/dL — ABNORMAL LOW (ref 0.50–1.10)
GFR calc Af Amer: >90 mL/min (ref >90)
GFR calc non Af Amer: >90 mL/min (ref >90)
Glucose, Bld: 102 mg/dL — ABNORMAL HIGH (ref 70–99)
Potassium: 3.7 mEq/L (ref 3.5–5.1)

## 2013-03-04 MED ORDER — DOCUSATE SODIUM 100 MG PO CAPS
100.0000 mg | ORAL_CAPSULE | Freq: Two times a day (BID) | ORAL | Status: DC
  Administered 2013-03-04 – 2013-03-05 (×3): 100 mg via ORAL
  Filled 2013-03-04 (×4): qty 1

## 2013-03-04 NOTE — Evaluation (Signed)
Physical Therapy Evaluation Patient Details Name: Veronica Chavez MRN: [id number] DOB: [birth year] Today's Date: 03/04/2013 Time: 4098-1191 PT Time Calculation (min): 22 min  PT Assessment / Plan / Recommendation History of Present Illness  [place] [employer]; s/p IM nailing Lt Femur fx  Clinical Impression  Patient is s/p IM nailing Lt femur surgery resulting in functional limitations due to the deficits listed below (see PT Problem List). Patient will benefit from skilled PT to increase their independence and safety with mobility to allow discharge to the venue listed below. Anticipate steady D/C; plan to address stairs in next session and possible D/C tomorrow from mobility standpoint if medically stable. Pt believes she is able to get RW and wheelchair from church and already has crutches. Will need 3 in 1 prior to D/C home.     PT Assessment  Patient needs continued PT services    Follow Up Recommendations  Home health PT;Supervision/Assistance - 24 hour    Does the patient have the potential to tolerate intense rehabilitation      Barriers to Discharge        Equipment Recommendations  3in1 (PT)    Recommendations for Other Services     Frequency Min 5X/week    Precautions / Restrictions Restrictions Weight Bearing Restrictions: Yes LLE Weight Bearing: Non weight bearing   Pertinent Vitals/Pain 2/10; anterior portion of Lt thigh      Mobility  Bed Mobility Bed Mobility: Supine to Sit;Sitting - Scoot to Edge of Bed Supine to Sit: 6: Modified independent (Device/Increase time);HOB elevated Sitting - Scoot to Edge of Bed: 6: Modified independent (Device/Increase time) Details for Bed Mobility Assistance: no physical (A) needed; pt relied on handrails  Transfers Transfers: Sit to Stand;Stand to Sit Sit to Stand: 5: Supervision;From bed;From toilet Stand to Sit: 6: Modified independent (Device/Increase time);To chair/3-in-1;To toilet Details for Transfer  Assistance: pt relied heavily on handicap rails for sit to stand transfers <> toilet; supervision for cues and safety with RW; min cues to maintain NWB status  Ambulation/Gait Ambulation/Gait Assistance: 5: Supervision Ambulation Distance (Feet): 50 Feet Assistive device: Rolling walker Ambulation/Gait Assistance Details: cues for RW management and to maintain NWB status; pt does good job maintaining NWB status 100% of time  Gait Pattern: Step-to pattern (hop to) Gait velocity: decr  Stairs: No Wheelchair Mobility Wheelchair Mobility: No         PT Diagnosis: Difficulty walking;Acute pain  PT Problem List: Decreased mobility;Decreased knowledge of use of DME;Pain PT Treatment Interventions: Gait training;DME instruction;Stair training;Therapeutic activities;Functional mobility training;Neuromuscular re-education;Therapeutic exercise;Balance training;Patient/family education     PT Goals(Current goals can be found in the care plan section) Acute Rehab PT Goals Patient Stated Goal: to go home PT Goal Formulation: With patient Time For Goal Achievement: 03/11/13 Potential to Achieve Goals: Good  Visit Information  Last PT Received On: 03/04/13 Assistance Needed: +1 History of Present Illness: fell from ladder at church; s/p IM nailing Lt Femur fx       Prior Functioning  Home Living Family/patient expects to be discharged to:: Private residence Living Arrangements: Spouse/significant other Available Help at Discharge: Family;Available 24 hours/day Type of Home: House Home Access: Stairs to enter [employer] of Steps: 4 Entrance Stairs-Rails: None Home Layout: Two level;Able to live on main level with bedroom/bathroom;1/2 bath on main level Alternate Level Stairs-Number of Steps: 15 Alternate Level Stairs-Rails: Right Additional Comments: has tub shower and walk in shower; walk in shower and built in bench  Prior Function Level  of Independence:  Independent Comments: reports she can have ramp built to get into house Communication Communication: No difficulties Dominant Hand: Right    Cognition  Cognition Arousal/Alertness: Awake/alert Behavior During Therapy: WFL for tasks assessed/performed Overall Cognitive Status: Within Functional Limits for tasks assessed    Extremity/Trunk Assessment Upper Extremity Assessment Upper Extremity Assessment: Defer to OT evaluation Lower Extremity Assessment Lower Extremity Assessment: LLE deficits/detail LLE Deficits / Details: limited ROM in Lt knee due to pain in anterior portion of thigh  LLE: Unable to fully assess due to pain;Unable to fully assess due to immobilization Cervical / Trunk Assessment Cervical / Trunk Assessment: Normal   Balance Balance Balance Assessed: Yes Static Standing Balance Static Standing - Balance Support: Bilateral upper extremity supported;During functional activity Static Standing - Level of Assistance: 5: Stand by assistance  End of Session PT - End of Session Equipment Utilized During Treatment: Gait belt Activity Tolerance: Patient tolerated treatment well Patient left: in chair;with call bell/phone within reach;with family/visitor present Nurse Communication: Mobility status  GP     Donell Sievert, [place] [phone] 03/04/2013, 11:03 AM

## 2013-03-04 NOTE — Progress Notes (Signed)
   Subjective: 1 Day Post-Op Procedure(s) (LRB): INTRAMEDULLARY (IM) NAIL FEMORAL (Left)  Pt with mild pain but overall doing well Ready to try PT Patient reports pain as mild.  Objective:   VITALS:   Filed Vitals:   03/04/13 0620  BP: 110/55  Pulse: 77  Temp: 99.1 F (37.3 C)  Resp: 16    Left hip incisions healing well nv intact distally No rashes or edema distally  LABS  Recent Labs  03/03/13 0550 03/03/13 1800 03/04/13 0525  HGB 11.3* 11.0* 9.2*  HCT 32.7* 31.5* 27.1*  WBC 7.0 10.0 7.4  PLT 167 151 163     Recent Labs  03/02/13 1537 03/03/13 0550 03/03/13 1630 03/04/13 0525  NA 138 134*  --  137  K 3.7 3.7  --  3.7  BUN 19 11  --  7  CREATININE 0.58 0.42* 0.43* 0.46*  GLUCOSE 110* 109*  --  102*     Assessment/Plan: 1 Day Post-Op Procedure(s) (LRB): INTRAMEDULLARY (IM) NAIL FEMORAL (Left)  PT/OT Strict non weight bearing left lower extremity Possible d/c as early as tomorrow dvt prophylaxis   Brad Atzin Buchta, MPAS, PA-C  03/04/2013, 7:58 AM

## 2013-03-04 NOTE — Progress Notes (Signed)
UR COMPLETED  

## 2013-03-04 NOTE — Evaluation (Signed)
Occupational Therapy Evaluation Patient Details Name: Veronica Chavez MRN: [id number] DOB: 08/15/1960 Today's Date: 03/04/2013 Time: 4098-1191 OT Time Calculation (min): 30 min  OT Assessment / Plan / Recommendation History of present illness [place] ladder at [employer]; s/p IM nailing Lt Femur fx   Clinical Impression   Pt demos decline in function with ADLs and ADL mobility safety following L femur IM nail. Pt would benefit from acute OT services to address impairments to help restore PLOF to return home safely    OT Assessment  Patient needs continued OT Services    Follow Up Recommendations  Supervision/Assistance - 24 hour;No OT follow up    Barriers to Discharge   none  Equipment Recommendations  3 in 1 bedside comode    Recommendations for Other Services    Frequency  Min 2X/week    Precautions / Restrictions Precautions Precautions: Fall Restrictions Weight Bearing Restrictions: Yes LLE Weight Bearing: Non weight bearing   Pertinent Vitals/Pain 5/10    ADL  Grooming: Performed;Wash/dry hands;Wash/dry face;Brushing hair;Min guard Where Assessed - Grooming: Supported standing Upper Body Bathing: Simulated;Supervision/safety;Set up Lower Body Bathing: Simulated;Moderate assistance Upper Body Dressing: Performed;Supervision/safety;Set up Lower Body Dressing: Performed;Moderate assistance Toilet Transfer: Dentist: Grab bars;Raised toilet seat with arms (or 3-in-1 over toilet) Toileting - Clothing Manipulation and Hygiene: Performed;Minimal assistance Where Assessed - Engineer, mining and Hygiene: Standing Tub/Shower Transfer: Simulated;Min Hydrographic surveyor: Shower seat without back;Grab bars;Walk in shower Equipment Used: Rolling walker;Gait belt;Other (comment);Long-handled shoe horn;Reacher;Long-handled sponge;Sock aid (3 in 1 over toilet) ADL Comments: pt and family provided with  education and demo of ADL A/E for use at home    OT Diagnosis: Generalized weakness  OT Problem List: Decreased knowledge of use of DME or AE;Impaired balance (sitting and/or standing);Pain OT Treatment Interventions: Self-care/ADL training;Balance training;Patient/family education;DME and/or AE instruction;Therapeutic activities   OT Goals(Current goals can be found in the care plan section) Acute Rehab OT Goals Patient Stated Goal: to go home OT Goal Formulation: With patient Time For Goal Achievement: 03/11/13 Potential to Achieve Goals: Good ADL Goals Pt Will Perform Grooming: with set-up;with supervision;standing Pt Will Perform Lower Body Bathing: with caregiver independent in assisting;with min assist;with adaptive equipment Pt Will Perform Lower Body Dressing: with min assist;with caregiver independent in assisting;with adaptive equipment Pt Will Transfer to Toilet: with modified independence Pt Will Perform Toileting - Clothing Manipulation and hygiene: with min guard assist;with supervision;sitting/lateral leans;sit to/from stand;with caregiver independent in assisting Pt Will Perform Tub/Shower Transfer: with supervision;with modified independence  Visit Information  Last OT Received On: 03/04/13 Assistance Needed: +1 History of Present Illness: fell from ladder at [employer]; s/p IM nailing Lt Femur fx       Prior Functioning     Home Living Family/patient expects to be discharged to:: Private residence Living Arrangements: Spouse/significant other Available Help at Discharge: Family;Available 24 hours/day Type of Home: House Home Access: Stairs to enter [employer] of Steps: 4 Entrance Stairs-Rails: None Home Layout: Two level;Able to live on main level with bedroom/bathroom;1/2 bath on main level Alternate Level Stairs-Number of Steps: 15 Alternate Level Stairs-Rails: Right Additional Comments: has tub shower and walk in shower; walk in shower and built  in bench  Prior Function Level of Independence: Independent Comments: reports she can have ramp built to get into house Communication Communication: No difficulties Dominant Hand: Right         Vision/Perception Vision - History Baseline Vision: Wears glasses only for reading Patient Visual Report: No  change from baseline Perception Perception: Within Functional Limits   Cognition  Cognition Arousal/Alertness: Awake/alert Behavior During Therapy: WFL for tasks assessed/performed Overall Cognitive Status: Within Functional Limits for tasks assessed    Extremity/Trunk Assessment Upper Extremity Assessment Upper Extremity Assessment: Overall WFL for tasks assessed Lower Extremity Assessment Lower Extremity Assessment: LLE deficits/detail LLE Deficits / Details: limited ROM in Lt knee due to pain in anterior portion of thigh  LLE: Unable to fully assess due to pain;Unable to fully assess due to immobilization Cervical / Trunk Assessment Cervical / Trunk Assessment: Normal     Mobility Bed Mobility Bed Mobility: Not assessed Supine to Sit: 6: Modified independent (Device/Increase time);HOB elevated Sitting - Scoot to Edge of Bed: 6: Modified independent (Device/Increase time) Details for Bed Mobility Assistance: pt up in recliner upon entering room Transfers Sit to Stand: 5: Supervision;From bed;From toilet Stand to Sit: 6: Modified independent (Device/Increase time);To chair/3-in-1;To toilet       Exercise     Balance Balance Balance Assessed: Yes Static Standing Balance Static Standing - Balance Support: Bilateral upper extremity supported;During functional activity Static Standing - Level of Assistance: 5: Stand by assistance Dynamic Standing Balance Dynamic Standing - Balance Support: Left upper extremity supported;During functional activity Dynamic Standing - Level of Assistance: 4: Min assist   End of Session OT - End of Session Equipment Utilized During  Treatment: Gait belt;Rolling walker;Other (comment) (3 in 1, ADL A/E) Activity Tolerance: Patient tolerated treatment well Patient left: in chair;with call bell/phone within reach;with family/visitor present  GO     Galen Manila 03/04/2013, 12:45 PM

## 2013-03-04 NOTE — Op Note (Signed)
NAMESARON, VANORMAN NO.:  [id number]  MEDICAL RECORD NO.:  [id number]  LOCATION:  5N30C                        FACILITY:  [facility]  PHYSICIAN:  Almedia Balls. Ranell Patrick, M.D. DATE OF BIRTH:  13-Jun-1961  DATE OF PROCEDURE:  03/02/2013 DATE OF DISCHARGE:                              OPERATIVE REPORT   PREOPERATIVE DIAGNOSIS:  Displaced left subtrochanteric femur fracture.  POSTOPERATIVE DIAGNOSIS:  Displaced left subtrochanteric femur fracture.  PROCEDURE PERFORMED:  Closed reduction and intramedullary nailing of left subtrochanteric femur fracture with Biomet Affixus nail system.  ATTENDING SURGEON:  Almedia Balls. Ranell Patrick, MD.  ASSISTANT:  None.  ANESTHESIA:  General anesthesia was used.  ESTIMATED BLOOD LOSS:  200 mL.  FLUID REPLACEMENT:  1500 mL of crystalloid.  INSTRUMENT COUNTS:  Correct.  COMPLICATIONS:  There were no complications.  ANTIBIOTICS:  Perioperative antibiotics were given.  INDICATIONS:  The patient is a 52 year old female with a history of a fall off a ladder injuring her left hip.  The patient presented with a displaced subtroch femur fracture.  Placed in Buck's traction and cleared medically.  The patient counseled regarding the need to provide stability to her fracture and properly align her subtroch fracture.  The plan was for an IM nail.  Informed consent obtained.  DESCRIPTION OF PROCEDURE:  After an adequate level of anesthesia was achieved, the patient was positioned supine in the operating room table. Perineal post utilized.  Right leg placed in modified lithotomy position.  Left leg placed in traction boot.  With distal traction, internal rotation, the fracture was aligned anatomically with the C-arm brought in, and multiplanar fluoroscopy was utilized during the case. After sterile prep and drape and a time-out called, we went ahead and entered the left hip area with a longitudinal incision proximal to the greater trochanter.   Dissection down through subcutaneous tissues. Tensor fascia lata split.  The greater trochanter identified.  A guide pin placed under direct vision with the C-arm in multiple planes.  We were happy with our placement of our guide pin and over reamed with step- cut drill, then placed our ball-tip guidewire into the distal femur measured to size 32 cm length and had them reamed for the Biomet Affixus system to a diameter of 13 mm.  We then selected the 11 mm diameter Affixus long nail and inserted that under fluoroscopic control across the fracture site. We had a nice fracture alignment.  We went ahead and then placed our lag screw using a guide pin first into the inferior portion of the neck and centered on the lateral.  We were pleased with that lag screw position, obtained final images proximally once we had done our set screw, which we tied it into place to create a fixed-angle construct. We went ahead then and removed our proximal jig, obtained final x-rays up top, then abducted the limb, obtained true lateral image of the distal interlocking holes and then placed two 4.5 distal interlocking screws using freehand perfect circle technique.  Once we had our final screws and final x-rays, we went ahead and thoroughly irrigated all wounds, closed in layered closure with 0 for the fascial layer, followed by 2-0 Vicryl subcutaneous closure and  staples.  Sterile Mepilex bandages were applied.  The patient was transported safely to the recovery room stretcher and to PACU in stable condition.     Almedia Balls. Ranell Patrick, M.D.     SRN/MEDQ  D:  03/03/2013  T:  03/03/2013  Job:  [id number]

## 2013-03-05 LAB — CBC
HCT: 25.3 % — ABNORMAL LOW (ref 36.0–46.0)
MCH: 30.1 pg (ref 26.0–34.0)
MCV: 86.6 fL (ref 78.0–100.0)
Platelets: 135 10*3/uL — ABNORMAL LOW (ref 150–400)
RBC: 2.92 MIL/uL — ABNORMAL LOW (ref 3.87–5.11)

## 2013-03-05 LAB — BASIC METABOLIC PANEL
BUN: 7 mg/dL (ref 6–23)
CO2: 26 mEq/L (ref 19–32)
Calcium: 8.2 mg/dL — ABNORMAL LOW (ref 8.4–10.5)
Creatinine, Ser: 0.46 mg/dL — ABNORMAL LOW (ref 0.50–1.10)
Glucose, Bld: 109 mg/dL — ABNORMAL HIGH (ref 70–99)

## 2013-03-05 MED ORDER — ENOXAPARIN SODIUM 40 MG/0.4ML ~~LOC~~ SOLN: 40.0000 mg | SUBCUTANEOUS | Status: DC

## 2013-03-05 MED ORDER — OXYCODONE HCL 5 MG PO TABS: 5.0000 mg | ORAL_TABLET | ORAL | Status: DC | PRN

## 2013-03-05 MED ORDER — METHOCARBAMOL 500 MG PO TABS: 500.0000 mg | ORAL_TABLET | Freq: Four times a day (QID) | ORAL | Status: DC | PRN

## 2013-03-05 NOTE — Progress Notes (Signed)
   Subjective: 2 Days Post-Op Procedure(s) (LRB): INTRAMEDULLARY (IM) NAIL FEMORAL (Left)  Pt sore but doing okay Did well with therapy Patient reports pain as mild.  Objective:   VITALS:   Filed Vitals:   03/05/13 0549  BP: 120/50  Pulse: 77  Temp: 98.5 F (36.9 C)  Resp: 18    Left hip incision healing well nv intact distally No rashes or edema  LABS  Recent Labs  03/03/13 1800 03/04/13 0525 03/05/13 0615  HGB 11.0* 9.2* 8.8*  HCT 31.5* 27.1* 25.3*  WBC 10.0 7.4 4.7  PLT 151 163 135*     Recent Labs  03/03/13 0550 03/03/13 1630 03/04/13 0525 03/05/13 0615  NA 134*  --  137 136  K 3.7  --  3.7 3.4*  BUN 11  --  7 7  CREATININE 0.42* 0.43* 0.46* 0.46*  GLUCOSE 109*  --  102* 109*     Assessment/Plan: 2 Days Post-Op Procedure(s) (LRB): INTRAMEDULLARY (IM) NAIL FEMORAL (Left)  Left hip doing well Recommend d/c home today  F/u in 2 weeks  Home health PT   Alphonsa Overall, MPAS, PA-C  03/05/2013, 10:05 AM

## 2013-03-05 NOTE — Progress Notes (Signed)
03/05/13 Spoke with patient about HHC. She selected [facility] from [facility] agencies [employer] with [facility] and set up HHPT. Patient states that she has a 3N1 at home.No other equipment needs identified. Jacquelynn Cree RN, BSN, CCM

## 2013-03-05 NOTE — Progress Notes (Signed)
Physical Therapy Treatment Patient Details Name: Veronica Chavez MRN: [id number] DOB: [birth year] Today's Date: 03/05/2013 Time: 9528-4132 PT Time Calculation (min): 14 min  PT Assessment / Plan / Recommendation  History of Present Illness [place] at [employer]; s/p IM nailing Lt Femur fx   PT Comments   Patient able to tolerate stair training without difficulty. Friend present throughout. Progressing well.   Follow Up Recommendations  Home health PT;Supervision/Assistance - 24 hour     Does the patient have the potential to tolerate intense rehabilitation     Barriers to Discharge        Equipment Recommendations  3in1 (PT)    Recommendations for Other Services    Frequency Min 5X/week   Progress towards PT Goals Progress towards PT goals: Progressing toward goals  Plan Current plan remains appropriate    Precautions / Restrictions Precautions Precautions: Fall Restrictions LLE Weight Bearing: Non weight bearing   Pertinent Vitals/Pain Denied pain    Mobility  Bed Mobility Bed Mobility: Sit to Supine Supine to Sit: 6: Modified independent (Device/Increase time);HOB elevated Sitting - Scoot to Edge of Bed: 6: Modified independent (Device/Increase time) Sit to Supine: 6: Modified independent (Device/Increase time) Transfers Sit to Stand: 6: Modified independent (Device/Increase time) Stand to Sit: 6: Modified independent (Device/Increase time) Ambulation/Gait Ambulation/Gait Assistance: 6: Modified independent (Device/Increase time) Ambulation Distance (Feet): 300 Feet Assistive device: Rolling walker Gait Pattern: Step-to pattern Stairs: Yes Stairs Assistance: 4: Min guard Stair Management Technique: Step to pattern;Forwards;No rails;With crutches Number of Stairs: 2    Exercises     PT Diagnosis:    PT Problem List:   PT Treatment Interventions:     PT Goals (current goals can now be found in the care plan section)    Visit Information  Last PT  Received On: 03/05/13 Assistance Needed: +1 History of Present Illness: [place] ladder at [employer]; s/p IM nailing Lt Femur fx    Subjective Data      Cognition  Cognition Arousal/Alertness: Awake/alert Behavior During Therapy: WFL for tasks assessed/performed Overall Cognitive Status: Within Functional Limits for tasks assessed    Balance     End of Session PT - End of Session Activity Tolerance: Patient tolerated treatment well Patient left: with call bell/phone within reach;with family/visitor present;in bed Nurse Communication: Mobility status   GP     Fredrich Birks 03/05/2013, 2:07 PM 03/05/2013 Fredrich Birks PTA [phone] pager [phone] office

## 2013-03-05 NOTE — Progress Notes (Signed)
OT Cancellation Note  Patient Details Name: Veronica Chavez MRN: [id number] DOB: [birth year]   Cancelled Treatment:    Reason Eval/Treat Not Completed: Patient declined, plans to d/c home today and stated that she just wants to work with PT on stairs this afternoon befre she leaves  Galen Manila 03/05/2013, 12:23 PM

## 2013-03-05 NOTE — Discharge Summary (Signed)
Physician Discharge Summary   Patient ID: Veronica Chavez MRN: [id number] DOB/AGE: 1961/06/11 52 y.o.  Admit date: 03/02/2013 Discharge date: 03/05/2013  Admission Diagnoses:  Left femur fracture  Discharge Diagnoses:  Same   Surgeries: Procedure(s): INTRAMEDULLARY (IM) NAIL FEMORAL on 03/02/2013 - 03/03/2013   Consultants: PT/OT  Discharged Condition: Stable  Hospital Course: Veronica Chavez is an 52 y.o. female who was admitted 03/02/2013 with a chief complaint of  Chief Complaint  Patient presents with  . Hip Injury  , and found to have a diagnosis of <principal problem not specified>.  They were brought to the operating room on 03/02/2013 - 03/03/2013 and underwent the above named procedures.    The patient had an uncomplicated hospital course and was stable for discharge.  Recent vital signs:  Filed Vitals:   03/05/13 0549  BP: 120/50  Pulse: 77  Temp: 98.5 F (36.9 C)  Resp: 18    Recent laboratory studies:  Results for orders placed during the hospital encounter of 03/02/13  SURGICAL PCR SCREEN      Result Value Range   MRSA, PCR NEGATIVE  NEGATIVE   Staphylococcus aureus NEGATIVE  NEGATIVE  BASIC METABOLIC PANEL      Result Value Range   Sodium 138  135 - 145 mEq/L   Potassium 3.7  3.5 - 5.1 mEq/L   Chloride 103  96 - 112 mEq/L   CO2 29  19 - 32 mEq/L   Glucose, Bld 110 (*) 70 - 99 mg/dL   BUN 19  6 - 23 mg/dL   Creatinine, Ser 4.09  0.50 - 1.10 mg/dL   Calcium 8.9  8.4 - 81.1 mg/dL   GFR calc non Af Amer >90  >90 mL/min   GFR calc Af Amer >90  >90 mL/min  CBC WITH DIFFERENTIAL      Result Value Range   WBC 8.8  4.0 - 10.5 K/uL   RBC 4.10  3.87 - 5.11 MIL/uL   Hemoglobin 12.0  12.0 - 15.0 g/dL   HCT 91.4 (*) 78.2 - 95.6 %   MCV 86.8  78.0 - 100.0 fL   MCH 29.3  26.0 - 34.0 pg   MCHC 33.7  30.0 - 36.0 g/dL   RDW 21.3  08.6 - 57.8 %   Platelets 166  150 - 400 K/uL   Neutrophils Relative % 85 (*) 43 - 77 %   Neutro Abs 7.5  1.7 - 7.7 K/uL   Lymphocytes  Relative 7 (*) 12 - 46 %   Lymphs Abs 0.6 (*) 0.7 - 4.0 K/uL   Monocytes Relative 7  3 - 12 %   Monocytes Absolute 0.6  0.1 - 1.0 K/uL   Eosinophils Relative 1  0 - 5 %   Eosinophils Absolute 0.1  0.0 - 0.7 K/uL   Basophils Relative 0  0 - 1 %   Basophils Absolute 0.0  0.0 - 0.1 K/uL  PROTIME-INR      Result Value Range   Prothrombin Time 13.9  11.6 - 15.2 seconds   INR 1.09  0.00 - 1.49  URINALYSIS, ROUTINE W REFLEX MICROSCOPIC      Result Value Range   Color, Urine YELLOW  YELLOW   APPearance CLEAR  CLEAR   Specific Gravity, Urine 1.020  1.005 - 1.030   pH 7.0  5.0 - 8.0   Glucose, UA NEGATIVE  NEGATIVE mg/dL   Hgb urine dipstick NEGATIVE  NEGATIVE   Bilirubin Urine NEGATIVE  NEGATIVE   Ketones,  ur NEGATIVE  NEGATIVE mg/dL   Protein, ur NEGATIVE  NEGATIVE mg/dL   Urobilinogen, UA 0.2  0.0 - 1.0 mg/dL   Nitrite NEGATIVE  NEGATIVE   Leukocytes, UA TRACE (*) NEGATIVE  URINE MICROSCOPIC-ADD ON      Result Value Range   Squamous Epithelial / LPF RARE  RARE   WBC, UA 0-2  <3 WBC/hpf   Urine-Other MUCOUS PRESENT    PROTIME-INR      Result Value Range   Prothrombin Time 14.0  11.6 - 15.2 seconds   INR 1.10  0.00 - 1.49  BASIC METABOLIC PANEL      Result Value Range   Sodium 134 (*) 135 - 145 mEq/L   Potassium 3.7  3.5 - 5.1 mEq/L   Chloride 100  96 - 112 mEq/L   CO2 25  19 - 32 mEq/L   Glucose, Bld 109 (*) 70 - 99 mg/dL   BUN 11  6 - 23 mg/dL   Creatinine, Ser 1.61 (*) 0.50 - 1.10 mg/dL   Calcium 8.3 (*) 8.4 - 10.5 mg/dL   GFR calc non Af Amer >90  >90 mL/min   GFR calc Af Amer >90  >90 mL/min  CBC      Result Value Range   WBC 7.0  4.0 - 10.5 K/uL   RBC 3.75 (*) 3.87 - 5.11 MIL/uL   Hemoglobin 11.3 (*) 12.0 - 15.0 g/dL   HCT 09.6 (*) 04.5 - 40.9 %   MCV 87.2  78.0 - 100.0 fL   MCH 30.1  26.0 - 34.0 pg   MCHC 34.6  30.0 - 36.0 g/dL   RDW 81.1  91.4 - 78.2 %   Platelets 167  150 - 400 K/uL  CREATININE, SERUM      Result Value Range   Creatinine, Ser 0.43 (*) 0.50  - 1.10 mg/dL   GFR calc non Af Amer >90  >90 mL/min   GFR calc Af Amer >90  >90 mL/min  CBC      Result Value Range   WBC 7.4  4.0 - 10.5 K/uL   RBC 3.14 (*) 3.87 - 5.11 MIL/uL   Hemoglobin 9.2 (*) 12.0 - 15.0 g/dL   HCT 95.6 (*) 21.3 - 08.6 %   MCV 86.3  78.0 - 100.0 fL   MCH 29.3  26.0 - 34.0 pg   MCHC 33.9  30.0 - 36.0 g/dL   RDW 57.8  46.9 - 62.9 %   Platelets 163  150 - 400 K/uL  BASIC METABOLIC PANEL      Result Value Range   Sodium 137  135 - 145 mEq/L   Potassium 3.7  3.5 - 5.1 mEq/L   Chloride 104  96 - 112 mEq/L   CO2 27  19 - 32 mEq/L   Glucose, Bld 102 (*) 70 - 99 mg/dL   BUN 7  6 - 23 mg/dL   Creatinine, Ser 5.28 (*) 0.50 - 1.10 mg/dL   Calcium 8.0 (*) 8.4 - 10.5 mg/dL   GFR calc non Af Amer >90  >90 mL/min   GFR calc Af Amer >90  >90 mL/min  CBC      Result Value Range   WBC 10.0  4.0 - 10.5 K/uL   RBC 3.66 (*) 3.87 - 5.11 MIL/uL   Hemoglobin 11.0 (*) 12.0 - 15.0 g/dL   HCT 41.3 (*) 24.4 - 01.0 %   MCV 86.1  78.0 - 100.0 fL   MCH 30.1  26.0 -  34.0 pg   MCHC 34.9  30.0 - 36.0 g/dL   RDW 16.1  09.6 - 04.5 %   Platelets 151  150 - 400 K/uL  CBC      Result Value Range   WBC 4.7  4.0 - 10.5 K/uL   RBC 2.92 (*) 3.87 - 5.11 MIL/uL   Hemoglobin 8.8 (*) 12.0 - 15.0 g/dL   HCT 40.9 (*) 81.1 - 91.4 %   MCV 86.6  78.0 - 100.0 fL   MCH 30.1  26.0 - 34.0 pg   MCHC 34.8  30.0 - 36.0 g/dL   RDW 78.2  95.6 - 21.3 %   Platelets 135 (*) 150 - 400 K/uL  BASIC METABOLIC PANEL      Result Value Range   Sodium 136  135 - 145 mEq/L   Potassium 3.4 (*) 3.5 - 5.1 mEq/L   Chloride 102  96 - 112 mEq/L   CO2 26  19 - 32 mEq/L   Glucose, Bld 109 (*) 70 - 99 mg/dL   BUN 7  6 - 23 mg/dL   Creatinine, Ser 0.86 (*) 0.50 - 1.10 mg/dL   Calcium 8.2 (*) 8.4 - 10.5 mg/dL   GFR calc non Af Amer >90  >90 mL/min   GFR calc Af Amer >90  >90 mL/min  TYPE AND SCREEN      Result Value Range   ABO/RH(D) O NEG     Antibody Screen NEG     Sample Expiration 03/05/2013    ABO/RH       Result Value Range   ABO/RH(D) O NEG      Discharge Medications:     Medication List    ASK your doctor about these medications       amphetamine-dextroamphetamine 30 MG 24 hr capsule  Commonly known as:  ADDERALL XR  Take 30 mg by mouth every morning.     Hormone Lotion Base Lotn  1 application by Does not apply route daily.     ibuprofen 200 MG tablet  Commonly known as:  ADVIL,MOTRIN  Take 200 mg by mouth every 6 (six) hours as needed for pain.        Diagnostic Studies: Dg Hip Complete Left  03/02/2013   *RADIOLOGY REPORT*  Clinical Data: Post fall, now with left hip pain  LEFT HIP - COMPLETE 2+ VIEW  Comparison: None.  Findings:  Examination is degraded secondary to patient's overlying hands and coins within her bilateral pockets.  There is a comminuted, minimally displaced fracture primarily involving the proximal diaphysis of the femur with extension to involve the lesser trochanter.  There is approximately 5 cm of foreshortening with associated mild varus angulation.  No definite dislocation.  Limited visualization of the adjacent pelvis is normal given limitations of the examination.  No definite radiopaque foreign body.  IMPRESSION: Comminuted, minimally displaced fracture primarily involving the proximal diaphysis of the femur with extension to involve the lesser trochanter.   Original Report Authenticated By: Tacey Ruiz, MD   Dg Femur Left  03/03/2013   *RADIOLOGY REPORT*  Clinical Data: Left femur fracture.  LEFT FEMUR - 2 VIEW,DG C-ARM 1-60 MIN  Fluoroscopic time:  33 seconds.  Comparison: 03/02/2013.  Findings: Five C-arm views submitted for review after surgery.  Left intratrochanteric fracture reduced with femoral rod with distal fixation and proximal sliding femoral neck screw with better alignment of fracture fragments.  Evidence of prior left knee surgery.  IMPRESSION: Open reduction and internal fixation of  a left intertrochanteric fracture without complication  detected.   Original Report Authenticated By: Lacy Duverney, M.D.   Dg Chest Port 1 View  03/02/2013   *RADIOLOGY REPORT*  Clinical Data: Preop for hip fracture.  PORTABLE CHEST - 1 VIEW  Comparison: 08/26/2006  Findings: Numerous leads and wires project over the chest.  Midline trachea.  Normal heart size and mediastinal contours. No pleural effusion or pneumothorax.  Diffuse peribronchial thickening.  Clear lungs.  IMPRESSION:  1. No acute cardiopulmonary disease. 2.  Interstitial thickening.  The clinical history states the patient is a nonsmoker.  Therefore, this could represent sequelae of asthma or chronic bronchitis.   Original Report Authenticated By: Jeronimo Greaves, M.D.   Dg C-arm 1-60 Min  03/03/2013   *RADIOLOGY REPORT*  Clinical Data: Left femur fracture.  LEFT FEMUR - 2 VIEW,DG C-ARM 1-60 MIN  Fluoroscopic time:  33 seconds.  Comparison: 03/02/2013.  Findings: Five C-arm views submitted for review after surgery.  Left intratrochanteric fracture reduced with femoral rod with distal fixation and proximal sliding femoral neck screw with better alignment of fracture fragments.  Evidence of prior left knee surgery.  IMPRESSION: Open reduction and internal fixation of a left intertrochanteric fracture without complication detected.   Original Report Authenticated By: Lacy Duverney, M.D.    Disposition:         Follow-up Information   Follow up with NORRIS,STEVEN R, MD. Call in 2 weeks. [phone])    Specialty:  Orthopedic Surgery   Contact information:   [address] [place] [address] [phone]        Signed: Thea Gist 03/05/2013, 10:06 AM

## 2014-12-11 IMAGING — CR DG HIP (WITH OR WITHOUT PELVIS) 2-3V*L*
3 series · 3 of 3 positions shown · non-contrast
Comparison: None.

CLINICAL DATA: Post fall, now with left hip pain

LEFT HIP - COMPLETE 2+ VIEW

[t pelvis ap]
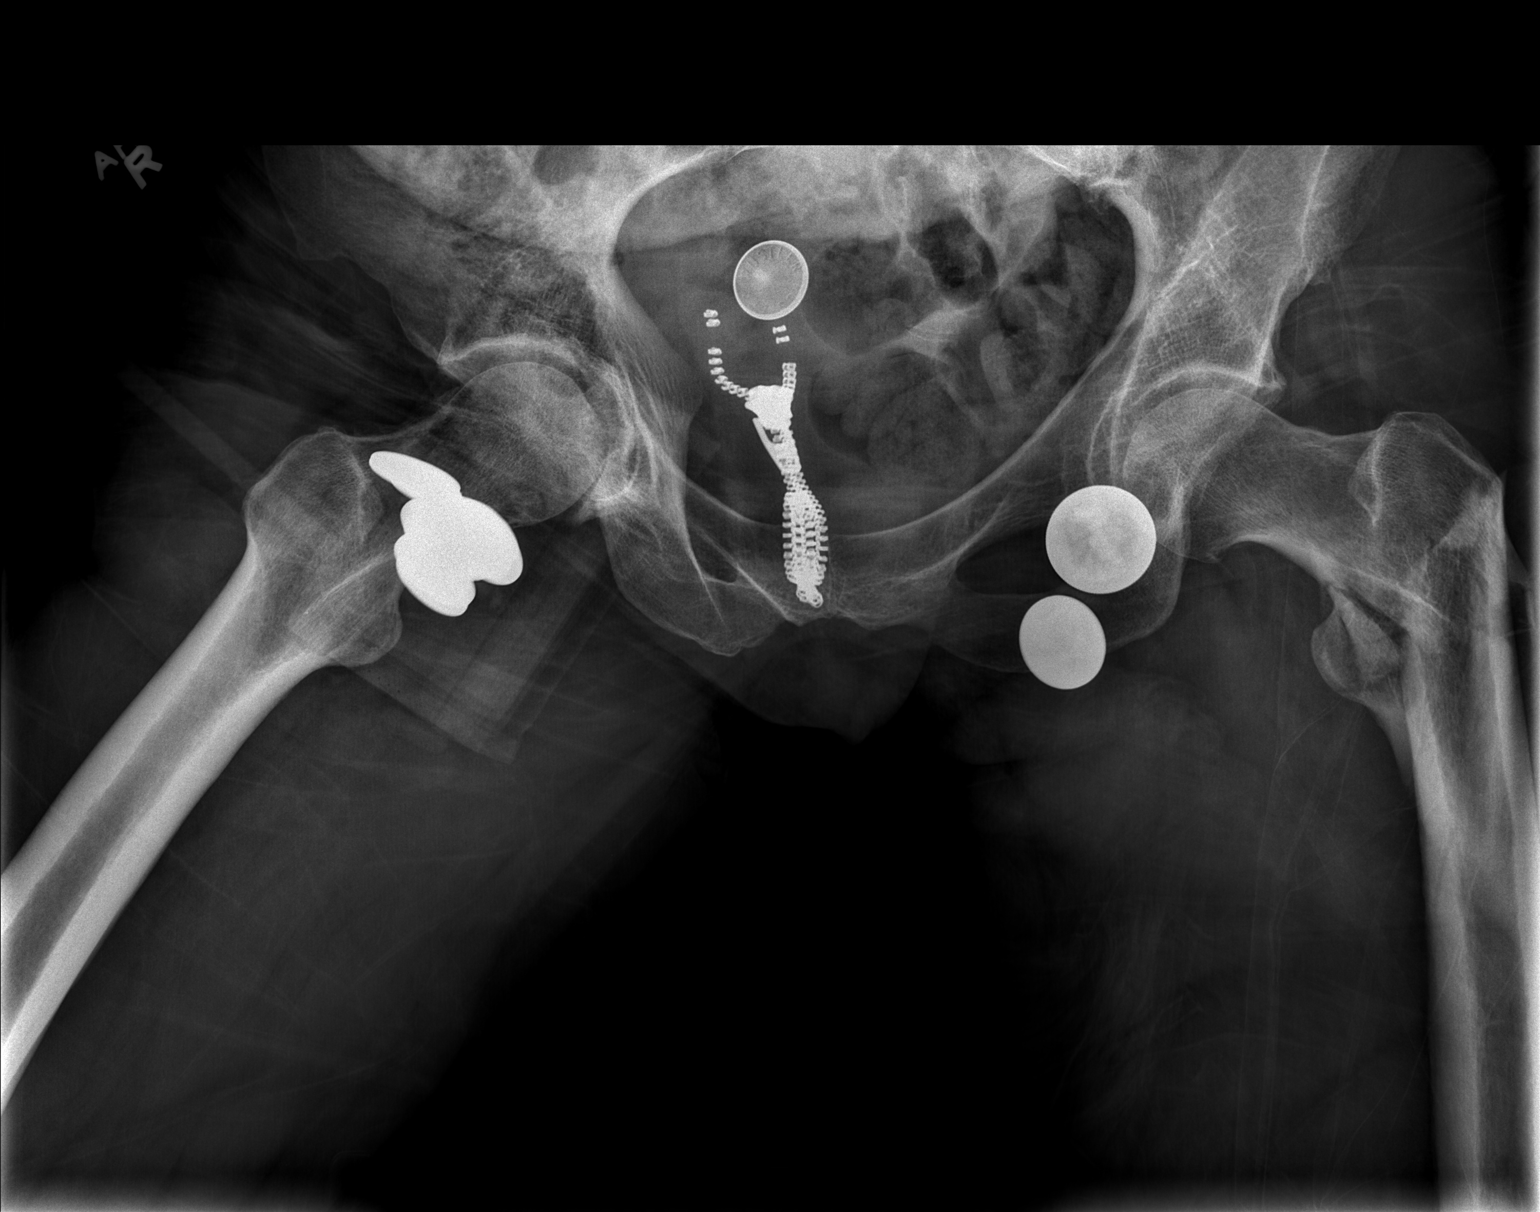

[t hip ap left]
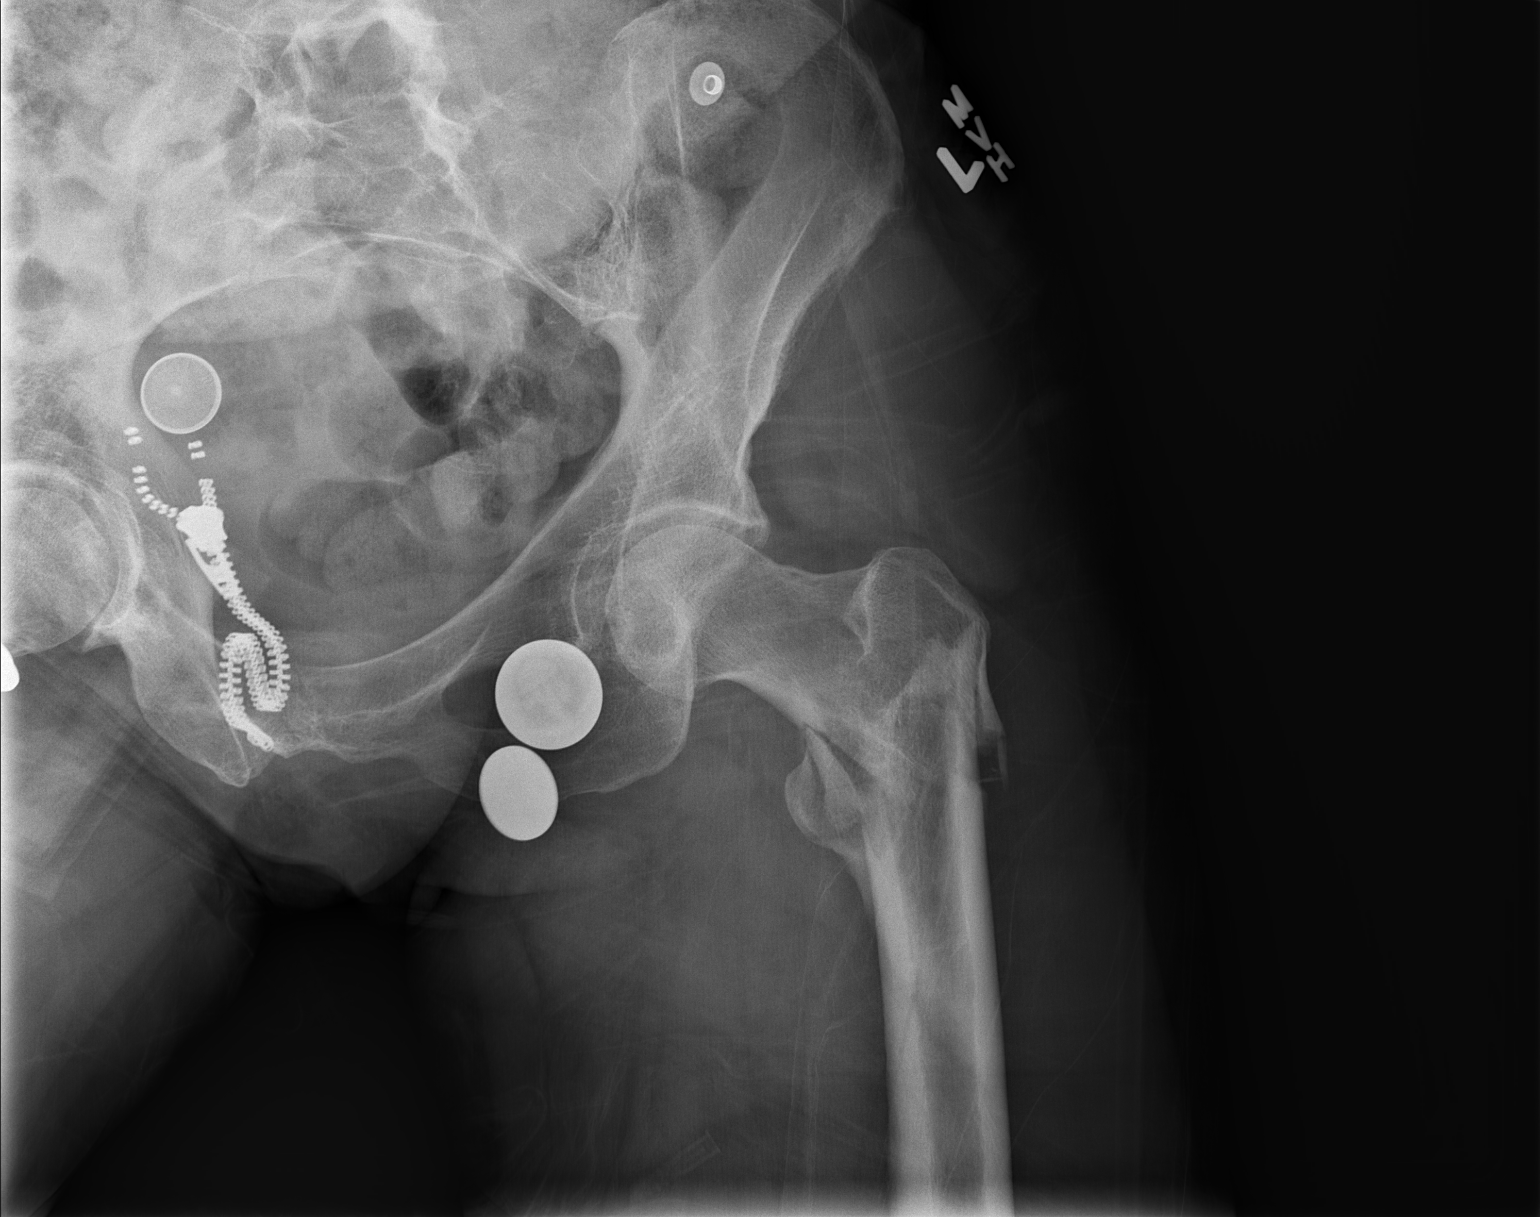

[w hip lat left]
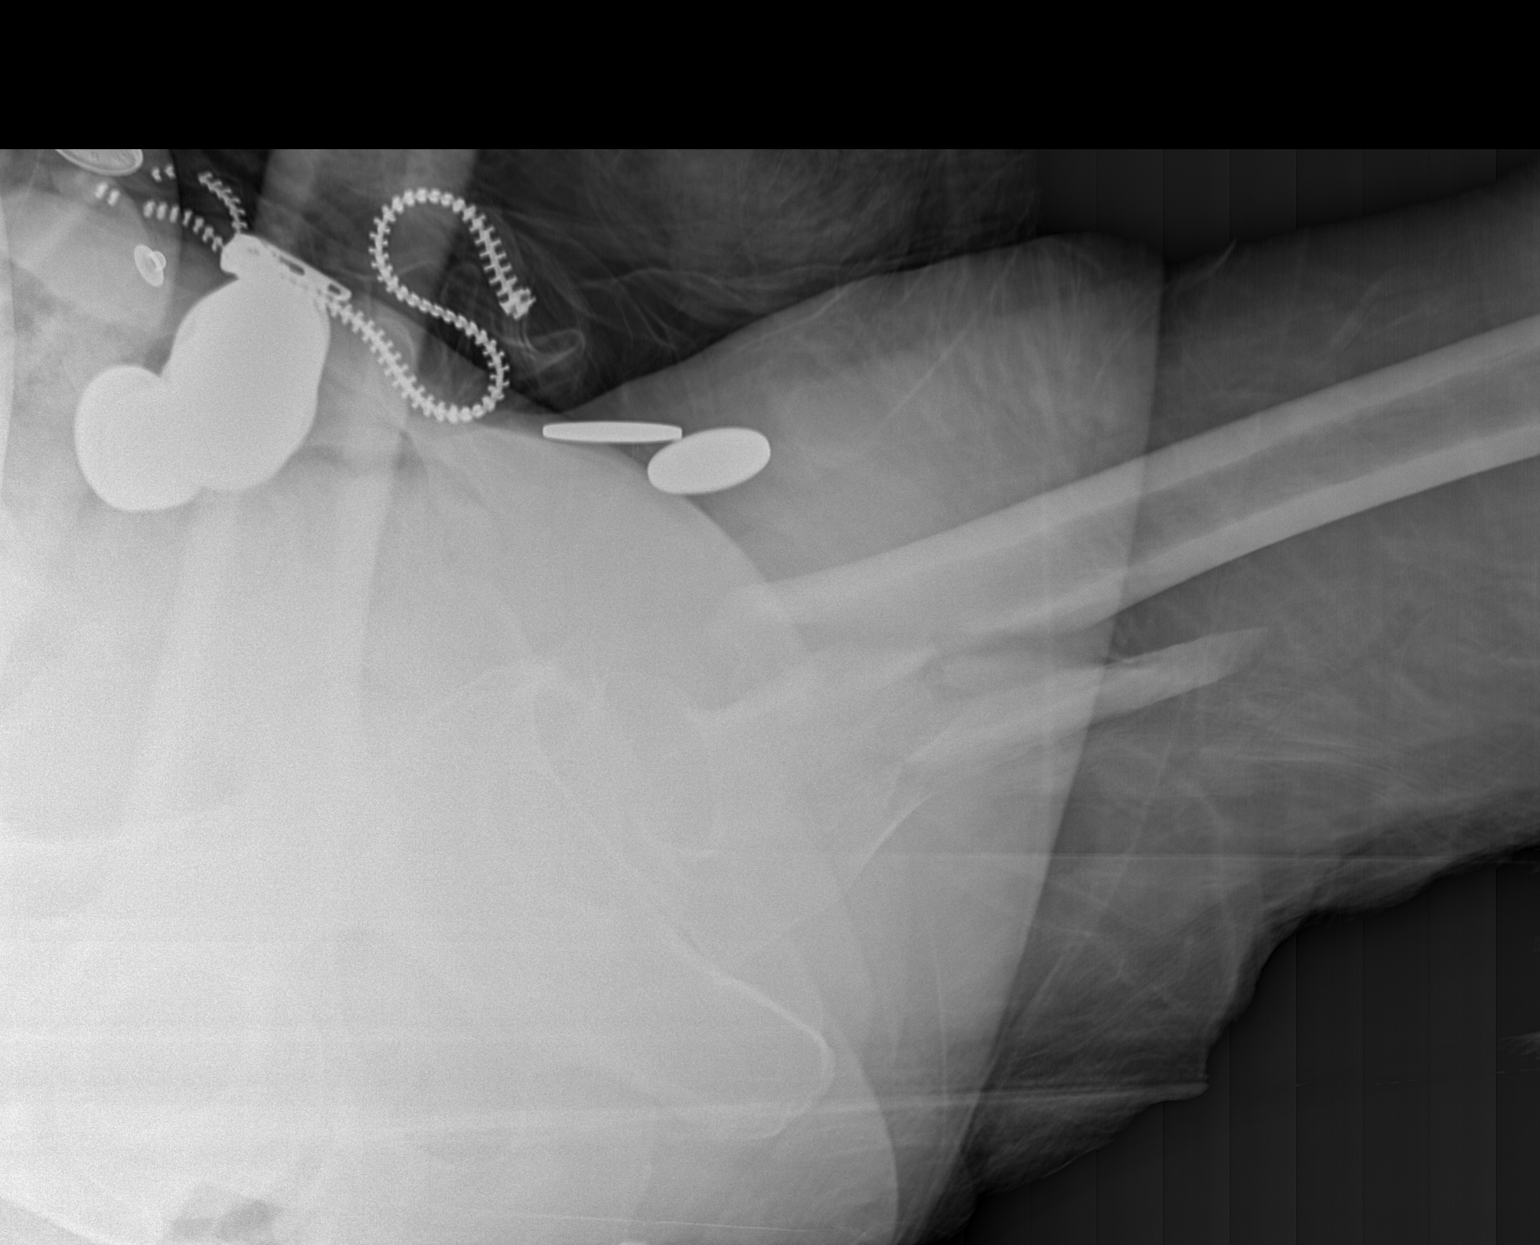

[3 of 3 positions shown; findings below may reference images not displayed]

FINDINGS: Examination is degraded secondary to patient's overlying hands and
coins within her bilateral pockets.

There is a comminuted, minimally displaced fracture primarily
involving the proximal diaphysis of the femur with extension to
involve the lesser trochanter.  There is approximately 5 cm of
foreshortening with associated mild varus angulation.  No definite
dislocation.  Limited visualization of the adjacent pelvis is
normal given limitations of the examination.  No definite
radiopaque foreign body.
IMPRESSION: Comminuted, minimally displaced fracture primarily involving the
proximal diaphysis of the femur with extension to involve the
lesser trochanter.

## 2015-10-27 ENCOUNTER — Telehealth: Payer: Self-pay | Admitting: Family Medicine

## 2015-10-27 NOTE — Telephone Encounter (Signed)
Pt informed Dr. Susann GivensLalonde will take her on as a new pt.  Left message for counselor  Alonza Smokeramela Duncan t# [phone] to send overview of pt's history for Dr. Susann GivensLalonde to review

## 2015-10-29 ENCOUNTER — Encounter: Payer: Self-pay | Admitting: Family Medicine

## 2015-10-29 ENCOUNTER — Ambulatory Visit (INDEPENDENT_AMBULATORY_CARE_PROVIDER_SITE_OTHER): Payer: Managed Care, Other (non HMO) | Admitting: Family Medicine

## 2015-10-29 VITALS — BP 148/90 | HR 87 | Ht 62.5 in | Wt 121.0 lb

## 2015-10-29 DIAGNOSIS — F4310 Post-traumatic stress disorder, unspecified: Secondary | ICD-10-CM | POA: Diagnosis not present

## 2015-10-29 NOTE — Progress Notes (Signed)
   Subjective:    Patient ID: Veronica Chavez, female    DOB: 10/31/1960, 55 y.o.   MRN: [id number]  HPI She is here for a consult concerning getting a letter about her PTSD. She is being followed by Elzie Ringsamela DuncanFor approximately the last 4 years. There is a note in her chart concerning this. It adequately documents the trauma that she suffered well in the [employer]. 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 She has remained in counseling with Tamela. There is also a history of possible ADD.There were apparently some injuries she had while she was in the [employer] and she has had difficulty getting the records. Review of Systems     Objective:   Physical Exam Alert and in no distress with appropriate affect and appropriately dressed.       Assessment & Plan:  PTSD (post-traumatic stress disorder) The letter from her does adequately demonstrate that she does have PTSD.I explained that I could write a letter stating that she has PTSD however it is not truly military related except it occurred while she was on duty. Recommended that she have her primary care doctor tried to get her military records to adequately document the injuries as that would be the best way for her to get rated.

## 2015-11-04 ENCOUNTER — Other Ambulatory Visit (HOSPITAL_COMMUNITY): Payer: Managed Care, Other (non HMO)

## 2015-11-10 ENCOUNTER — Ambulatory Visit (HOSPITAL_COMMUNITY)
Admission: RE | Admit: 2015-11-10 | Payer: Managed Care, Other (non HMO) | Source: Ambulatory Visit | Admitting: Orthopedic Surgery

## 2015-11-10 ENCOUNTER — Encounter (HOSPITAL_COMMUNITY): Admission: RE | Payer: Self-pay | Source: Ambulatory Visit

## 2015-11-10 SURGERY — ARTHROPLASTY, HIP, TOTAL, ANTERIOR APPROACH
Anesthesia: Spinal | Laterality: Right

## 2019-01-28 DIAGNOSIS — M25551 Pain in right hip: Secondary | ICD-10-CM | POA: Insufficient documentation

## 2019-10-17 ENCOUNTER — Ambulatory Visit: Payer: Managed Care, Other (non HMO) | Admitting: Podiatry

## 2019-10-17 ENCOUNTER — Ambulatory Visit (INDEPENDENT_AMBULATORY_CARE_PROVIDER_SITE_OTHER): Payer: Managed Care, Other (non HMO)

## 2019-10-17 ENCOUNTER — Other Ambulatory Visit: Payer: Self-pay

## 2019-10-17 VITALS — Temp 97.2°F

## 2019-10-17 DIAGNOSIS — M2012 Hallux valgus (acquired), left foot: Secondary | ICD-10-CM

## 2019-10-17 DIAGNOSIS — M2011 Hallux valgus (acquired), right foot: Secondary | ICD-10-CM | POA: Diagnosis not present

## 2019-10-17 DIAGNOSIS — M21961 Unspecified acquired deformity of right lower leg: Secondary | ICD-10-CM

## 2019-10-17 DIAGNOSIS — M7741 Metatarsalgia, right foot: Secondary | ICD-10-CM | POA: Diagnosis not present

## 2019-10-17 DIAGNOSIS — G5763 Lesion of plantar nerve, bilateral lower limbs: Secondary | ICD-10-CM

## 2019-10-17 DIAGNOSIS — M21962 Unspecified acquired deformity of left lower leg: Secondary | ICD-10-CM

## 2019-10-17 DIAGNOSIS — M7742 Metatarsalgia, left foot: Secondary | ICD-10-CM

## 2019-10-17 MED ORDER — FLUCONAZOLE 150 MG PO TABS: 150.0000 mg | ORAL_TABLET | ORAL | 1 refills | Status: DC

## 2019-10-20 NOTE — Progress Notes (Signed)
  Subjective:  Patient ID: Veronica Chavez, female    DOB: May 13, 1961,  MRN: [id number]  Chief Complaint  Patient presents with  . Foot Problem    Burning, shooting pain - bilateral plantar forefoot and toes. L worse than the R. x4+ yrs. Pt stated, "[facility] took x-rays 1 month ago. Diagnosed with bunions and hammertoes. They would only discuss surgery - I want to know about all of my options. Pain = 7/10 most of the time".   . Nail Problem    Pt stated, "I have nail fungus. I'm interested in a prescription".    59 y.o. female presents with the above complaint. History confirmed with patient.   Objective:  Physical Exam: warm, good capillary refill, no trophic changes or ulcerative lesions, normal DP and PT pulses and normal sensory exam.  HAV deformity bilaterally with pain palpation about the third interspace.  Pain location about the second MPJ.  Hammertoe deformities bilaterally.  No images are attached to the encounter.  Radiographs: X-ray of the right foot: Hallux valgus deformity elongated second metatarsal Assessment:   1. Valgus deformity of both great toes   2. Morton's neuroma of both feet   3. Metatarsalgia of both feet   4. Metatarsal deformity, right   5. Metatarsal deformity, left    Plan:  Patient was evaluated and treated and all questions answered.  Bunion and Morton Neuroma -Discussed with patient likely need for surgical intervention at a later date.  We also discussed trialing injections for the neuroma prior to surgery. -Injections letter to the third interspace bilateral -Discuss progress at next visit and plan for possible surgery or repeat injection therapy  Procedure: Neuroma Injection Location: Bilateral 3rd interspace Skin Prep: Alcohol. Injectate: 0.5 cc 0.5% marcaine plain, 0.5 cc celestone  Disposition: Patient tolerated procedure well. Injection site dressed with a band-aid.   Onychomycosis -Rx fluconazole weekly.  Educated on Rx No  follow-ups on file.

## 2019-11-08 ENCOUNTER — Other Ambulatory Visit: Payer: Self-pay

## 2019-11-08 ENCOUNTER — Ambulatory Visit: Payer: Managed Care, Other (non HMO) | Admitting: Podiatry

## 2019-11-08 DIAGNOSIS — M2012 Hallux valgus (acquired), left foot: Secondary | ICD-10-CM

## 2019-11-08 DIAGNOSIS — M2011 Hallux valgus (acquired), right foot: Secondary | ICD-10-CM | POA: Diagnosis not present

## 2019-11-08 DIAGNOSIS — M7741 Metatarsalgia, right foot: Secondary | ICD-10-CM

## 2019-11-08 DIAGNOSIS — M2042 Other hammer toe(s) (acquired), left foot: Secondary | ICD-10-CM

## 2019-11-08 DIAGNOSIS — M7742 Metatarsalgia, left foot: Secondary | ICD-10-CM

## 2019-11-08 DIAGNOSIS — G5763 Lesion of plantar nerve, bilateral lower limbs: Secondary | ICD-10-CM | POA: Diagnosis not present

## 2019-11-08 MED ORDER — FLUCONAZOLE 150 MG PO TABS: 150.0000 mg | ORAL_TABLET | ORAL | 1 refills | Status: AC

## 2019-11-08 NOTE — Progress Notes (Signed)
  Subjective:  Patient ID: Veronica Chavez, female    DOB: 07-23-1960,  MRN: [id number]  Chief Complaint  Patient presents with  . Bunions    Pt wants to discuss possible bunion surgery.  . Callouses    Bilateral plantar callous trim  . Neuroma    Pt states injections have only provided temporary relief, still having pain.    59 y.o. female presents with the above complaint. History confirmed with patient.   Objective:  Physical Exam: warm, good capillary refill, no trophic changes or ulcerative lesions, normal DP and PT pulses and normal sensory exam.  HAV deformity bilaterally with pain palpation about the third interspace.  Pain location about the second MPJ.  Hammertoe deformities bilaterally.  No images are attached to the encounter.  Radiographs: 4/22: X-ray of the right foot: Hallux valgus deformity elongated second metatarsal Assessment:   1. Valgus deformity of both great toes   2. Morton's neuroma of both feet   3. Metatarsalgia of both feet   4. Hammertoe of left foot    Plan:  Patient was evaluated and treated and all questions answered.  Bunion and Morton Neuroma; Hammertoes -Discussed surgical intervention today.   -Patient has failed all conservative therapy and wishes to proceed with surgical intervention. All risks, benefits, and alternatives discussed with patient. No guarantees given. Consent reviewed and signed by patient. -Planned procedures: Left austin bunionectomy, 2nd-4th hammertoe corrrections, excision of neuroma left 3rd interspace, possible 2nd met shortening osteotomy   No follow-ups on file.

## 2019-11-26 ENCOUNTER — Telehealth: Payer: Self-pay

## 2019-11-26 NOTE — Telephone Encounter (Signed)
DOS 12/11/2019  [place] BUNIONECTOMY LT - 28296 METATARSAL OSTEOTOMY 2ND LT - 28308 NEURECTOMY LT - 28080 HAMMERTOE REPAIR 2,3,4 LT - 03833  [employer] EFFECTIVE DATE - 08/25/2015  PLAN DEDUCTIBLE - $2500 W/ $0.00 MET OUT OF POCKET - $5000 W/ $0.00 MET  PER AUTOMATED SYSTEM NO PRECERT IS REQUIRED FOR CPT 28296, 38329, 19166 OR 06004

## 2019-12-11 ENCOUNTER — Other Ambulatory Visit: Payer: Self-pay | Admitting: Podiatry

## 2019-12-11 ENCOUNTER — Encounter: Payer: Self-pay | Admitting: Podiatry

## 2019-12-11 DIAGNOSIS — M2042 Other hammer toe(s) (acquired), left foot: Secondary | ICD-10-CM | POA: Diagnosis not present

## 2019-12-11 DIAGNOSIS — M2012 Hallux valgus (acquired), left foot: Secondary | ICD-10-CM | POA: Diagnosis not present

## 2019-12-11 DIAGNOSIS — G5762 Lesion of plantar nerve, left lower limb: Secondary | ICD-10-CM | POA: Diagnosis not present

## 2019-12-11 MED ORDER — ONDANSETRON HCL 4 MG PO TABS: 4.0000 mg | ORAL_TABLET | Freq: Three times a day (TID) | ORAL | 0 refills | Status: AC | PRN

## 2019-12-11 MED ORDER — CEPHALEXIN 500 MG PO CAPS: 500.0000 mg | ORAL_CAPSULE | Freq: Two times a day (BID) | ORAL | 0 refills | Status: AC

## 2019-12-11 MED ORDER — OXYCODONE-ACETAMINOPHEN 10-325 MG PO TABS: 1.0000 | ORAL_TABLET | ORAL | 0 refills | Status: DC | PRN

## 2019-12-11 NOTE — Progress Notes (Signed)
Rx sent to pharmacy for outpatient surgery.  [facility] [place]

## 2019-12-12 ENCOUNTER — Telehealth: Payer: Self-pay | Admitting: Podiatry

## 2019-12-12 ENCOUNTER — Other Ambulatory Visit: Payer: Self-pay | Admitting: Podiatry

## 2019-12-12 MED ORDER — PROMETHAZINE HCL 25 MG PO TABS: 25.0000 mg | ORAL_TABLET | Freq: Three times a day (TID) | ORAL | 0 refills | Status: AC | PRN

## 2019-12-12 MED ORDER — HYDROCODONE-ACETAMINOPHEN 10-325 MG PO TABS: 1.0000 | ORAL_TABLET | ORAL | 0 refills | Status: AC | PRN

## 2019-12-12 NOTE — Telephone Encounter (Signed)
I spoke with pt she states she has thrown up 4 times, is taking the zofran, then eating toast and taking the percocet 20 minutes later then about 30 minute later she throws up. Pt states she is not sure of a pain medication she can take.

## 2019-12-12 NOTE — Telephone Encounter (Signed)
Pt called stating she is having a bad reaction to her pain medication. Pt had surgery on 11/26/19. Pt is experiencing severe nausea and is itching all over her body.   Please give patient a call

## 2019-12-16 ENCOUNTER — Encounter: Payer: Self-pay | Admitting: Podiatry

## 2019-12-20 ENCOUNTER — Ambulatory Visit (INDEPENDENT_AMBULATORY_CARE_PROVIDER_SITE_OTHER): Payer: Managed Care, Other (non HMO) | Admitting: Podiatry

## 2019-12-20 ENCOUNTER — Ambulatory Visit (INDEPENDENT_AMBULATORY_CARE_PROVIDER_SITE_OTHER): Payer: Managed Care, Other (non HMO)

## 2019-12-20 ENCOUNTER — Other Ambulatory Visit: Payer: Self-pay

## 2019-12-20 VITALS — Temp 98.2°F

## 2019-12-20 DIAGNOSIS — M21962 Unspecified acquired deformity of left lower leg: Secondary | ICD-10-CM | POA: Diagnosis not present

## 2019-12-20 DIAGNOSIS — M79672 Pain in left foot: Secondary | ICD-10-CM

## 2019-12-21 ENCOUNTER — Encounter: Payer: Self-pay | Admitting: Podiatry

## 2019-12-21 NOTE — Progress Notes (Signed)
Subjective:  Patient ID: Veronica Chavez, female    DOB: Oct 07, 1960,  MRN: [id number]  Chief Complaint  Patient presents with  . Routine Post Op    POV#1 DOS 6.1.2021 CORRECTION LT FOOT BUNION, CORRECTION HAMMERTOE 2,3,4 LT, EXCISION INTERDIGITAL NEUROMA, POSSIBLE 2ND METATARSAL SHORTENING OSTEOTOMY 2ND LT. Pt states had lots of nausea and vomiting in days following surgery which was made worse by her pain medication. Pt states she took Tylenol instead which has been better, and her vomiting and nausea has greatly decreased. Denies fever/chills. Pt states some pain but nothing too much.     59 y.o. female returns for post-op check. Agree with above statement. She is doing well overall. She denies any fever chills. She has been keeping the bandages dry clean and intact. She has been ambulating with a cam boot.  Review of Systems: Negative except as noted in the HPI. Denies N/V/F/Ch.  Past Medical History:  Diagnosis Date  . Allergy   . Arthritis   . Heart murmur     Current Outpatient Medications:  .  amphetamine-dextroamphetamine (ADDERALL XR) 30 MG 24 hr capsule, Take 30 mg by mouth every morning., Disp: , Rfl:  .  brimonidine (ALPHAGAN) 0.2 % ophthalmic solution, Place 1 drop into the left eye 3 (three) times daily., Disp: , Rfl:  .  cephALEXin (KEFLEX) 500 MG capsule, Take 1 capsule (500 mg total) by mouth 2 (two) times daily., Disp: 14 capsule, Rfl: 0 .  dorzolamide-timolol (COSOPT) 22.3-6.8 MG/ML ophthalmic solution, Place 1 drop into the left eye 2 (two) times daily., Disp: , Rfl: 11 .  fluconazole (DIFLUCAN) 150 MG tablet, Take 1 tablet (150 mg total) by mouth once a week., Disp: 6 tablet, Rfl: 1 .  Ginger, Zingiber officinalis, (GINGER PO), Take 1 capsule by mouth 2 (two) times daily., Disp: , Rfl:  .  Hormone Lotion Base LOTN, Apply 1 application topically daily. , Disp: , Rfl:  .  HYDROcodone-acetaminophen (NORCO) 10-325 MG tablet, Take 1 tablet by mouth every 4 (four) hours as  needed., Disp: 20 tablet, Rfl: 0 .  latanoprost (XALATAN) 0.005 % ophthalmic solution, Place 1 drop into both eyes at bedtime., Disp: , Rfl: 11 .  methylphenidate 36 MG PO CR tablet, methylphenidate ER 36 mg tablet,extended release 24 hr  TAKE 1 TABLET BY MOUTH EVERY MORNING, Disp: , Rfl:  .  Multiple Vitamin (MULTIVITAMIN WITH MINERALS) TABS tablet, Take 1 tablet by mouth daily., Disp: , Rfl:  .  ondansetron (ZOFRAN) 4 MG tablet, Take 1 tablet (4 mg total) by mouth every 8 (eight) hours as needed for nausea or vomiting., Disp: 20 tablet, Rfl: 0 .  promethazine (PHENERGAN) 25 MG tablet, Take 1 tablet (25 mg total) by mouth every 8 (eight) hours as needed for nausea or vomiting., Disp: 20 tablet, Rfl: 0 .  Travoprost, BAK Free, (TRAVATAN) 0.004 % SOLN ophthalmic solution, travoprost 0.004 % eye drops, Disp: , Rfl:  .  TURMERIC PO, Take 1 capsule by mouth 2 (two) times daily., Disp: , Rfl:   Social History   Tobacco Use  Smoking Status Never Smoker    Allergies  Allergen Reactions  . Codeine Itching and Nausea Only  . Percocet [Oxycodone-Acetaminophen] Itching and Nausea And Vomiting   Objective:   Vitals:   12/20/19 1013  Temp: 98.2 F (36.8 C)   There is no height or weight on file to calculate BMI. Constitutional Well developed. Well nourished.  Vascular Foot warm and well perfused. Capillary refill normal to  all digits.   Neurologic Normal speech. Oriented to person, place, and time. Epicritic sensation to light touch grossly present bilaterally.  Dermatologic Skin healing well without signs of infection. Skin edges well coapted without signs of infection.  Orthopedic: Tenderness to palpation noted about the surgical site.   Radiographs: Three views of skeletally mature adult left foot: Good correction and alignment noted: Pins are intact. No signs of loosening or breaking noted. No fractures noted. Assessment:   1. Pain in left foot   2. Metatarsal deformity, left     Plan:  Patient was evaluated and treated and all questions answered.  S/p foot surgery left -Progressing as expected post-operatively. -XR: See above -WB Status: Weightbearing as tolerated in cam boot -Sutures: Intact. No signs of dehiscence noted. No clinical signs of infection noted. -Medications: None -Foot redressed.  No follow-ups on file.

## 2019-12-23 ENCOUNTER — Telehealth: Payer: Self-pay | Admitting: Podiatry

## 2019-12-23 NOTE — Telephone Encounter (Signed)
Pt called stating that she really needs to have her driving restrictions lifted due to the hardship her family is having being that she can not drive please assist

## 2019-12-23 NOTE — Telephone Encounter (Signed)
Called and spoke to patient.

## 2019-12-23 NOTE — Telephone Encounter (Signed)
Left message informing pt I would pass her request to Dr. Samuella Cota and that it may be difficult, because in [place] it was illegal for a driver to have any splint of any type on and drive.

## 2019-12-27 ENCOUNTER — Ambulatory Visit (INDEPENDENT_AMBULATORY_CARE_PROVIDER_SITE_OTHER): Payer: Managed Care, Other (non HMO) | Admitting: Podiatry

## 2019-12-27 ENCOUNTER — Other Ambulatory Visit: Payer: Self-pay

## 2019-12-27 ENCOUNTER — Ambulatory Visit (INDEPENDENT_AMBULATORY_CARE_PROVIDER_SITE_OTHER): Payer: Managed Care, Other (non HMO)

## 2019-12-27 DIAGNOSIS — M21962 Unspecified acquired deformity of left lower leg: Secondary | ICD-10-CM | POA: Diagnosis not present

## 2019-12-27 NOTE — Progress Notes (Addendum)
  Subjective:  Patient ID: Veronica Chavez, female    DOB: 08/23/1960,  MRN: [id number]  Chief Complaint  Patient presents with  . Routine Post Op    POV DOS 6.1.2021 CORRECTION LT FOOT BUNION, CORRECTION HAMMERTOE 2,3,4 LT, EXCISION OF INTERDIGITAL NEUROMA. Pt states healing well without any concerns. Denies fever/chills/nausea/vomiting.    DOS: 11/26/19 Procedure: Correction Left foot, hammertoes 2,3,4, interdigital neuroma.   59 y.o. female presents with the above complaint. History confirmed with patient.  Objective:  Physical Exam: tenderness at the surgical site, local edema noted and calf supple, nontender. Incision: healing well, no significant drainage, no dehiscence, no significant erythema  No images are attached to the encounter.  Radiographs: X-ray of the left foot: consistent with post-op state, no hardware failure.  Assessment:   1. Metatarsal deformity, left    Plan:  Patient was evaluated and treated and all questions answered.  Post-operative State -XR reviewed with patient -WBAT in CAM boot  -F/u in 2 weeks for repeat XRs.  No follow-ups on file.

## 2020-01-01 ENCOUNTER — Encounter: Payer: Self-pay | Admitting: Podiatry

## 2020-01-03 ENCOUNTER — Telehealth: Payer: Self-pay | Admitting: Podiatry

## 2020-01-03 NOTE — Telephone Encounter (Signed)
I spoke with pt and she states her hip, knee and back are killing her due to her walking in the surgery boot, she had rigged the opposing shoe to be the same height, but is still in some discomfort. I told pt that although Dr. Al Corpus stated she could have more activity, if she already had hip, knee, and back problems she needed to slow and continue to wear the boot. Pt states understanding.

## 2020-01-03 NOTE — Telephone Encounter (Signed)
Pt called stating that wearing the boot from surgry is hurting her knee and back and wants to know what to do please assist

## 2020-01-10 ENCOUNTER — Ambulatory Visit (INDEPENDENT_AMBULATORY_CARE_PROVIDER_SITE_OTHER): Payer: Managed Care, Other (non HMO) | Admitting: Podiatry

## 2020-01-10 ENCOUNTER — Ambulatory Visit (INDEPENDENT_AMBULATORY_CARE_PROVIDER_SITE_OTHER): Payer: Managed Care, Other (non HMO)

## 2020-01-10 ENCOUNTER — Other Ambulatory Visit: Payer: Self-pay

## 2020-01-10 DIAGNOSIS — M2042 Other hammer toe(s) (acquired), left foot: Secondary | ICD-10-CM

## 2020-01-10 DIAGNOSIS — Z9889 Other specified postprocedural states: Secondary | ICD-10-CM

## 2020-01-10 NOTE — Progress Notes (Signed)
  Subjective:  Patient ID: Veronica Chavez, female    DOB: 1960/08/26,  MRN: [id number]  Chief Complaint  Patient presents with  . Routine Post Op    [facility] DOS 6.16.2021 Correction of left foot bunion, correction of hammertoe 2,3,4, excision of interdigital neuroma. Pt states some left lateral foot pain. Denies fever/chills/nausea/vomiting.  . Foot Orthotics    Pt states interest in orthotics.   DOS: 11/26/19 Procedure: Correction Left foot, hammertoes 2,3,4, interdigital neuroma.   59 y.o. female presents with the above complaint. History confirmed with patient.  Objective:  Physical Exam: tenderness at the surgical site, local edema noted and calf supple, nontender. Incision: healing well, no significant drainage, no dehiscence, no significant erythema  No images are attached to the encounter.  Radiographs: X-ray of the left foot: consistent with post-op state, no hardware failure.  Assessment:   1. Hammertoe of left foot   2. Post-operative state    Plan:  Patient was evaluated and treated and all questions answered.  Post-operative State -XR reviewed with patient -Ok to start showering at this time. Advised they cannot soak. -WBAT in Surgical shoe -Surgical shoe dispensed -Pins pulled  -Toe alignment splint dispensed. -F/u in 2 weeks   Return in about 2 weeks (around 01/24/2020) for Post-Op (No XRs).

## 2020-01-17 ENCOUNTER — Telehealth: Payer: Self-pay | Admitting: Podiatry

## 2020-01-17 NOTE — Telephone Encounter (Signed)
Left message informing pt that if the pain began after putting the insert in the surgical shoe she should remove it from the shoe, rest, ice and elevate, call to discuss further, but I wanted her to get the insert out of the shoe.

## 2020-01-17 NOTE — Telephone Encounter (Signed)
Pt called stating that she is having different pain  She added a insert to surgical shoe wanted advice

## 2020-01-24 ENCOUNTER — Ambulatory Visit (INDEPENDENT_AMBULATORY_CARE_PROVIDER_SITE_OTHER): Payer: Managed Care, Other (non HMO) | Admitting: Podiatry

## 2020-01-24 ENCOUNTER — Other Ambulatory Visit: Payer: Self-pay

## 2020-01-24 DIAGNOSIS — M2042 Other hammer toe(s) (acquired), left foot: Secondary | ICD-10-CM

## 2020-01-24 DIAGNOSIS — M722 Plantar fascial fibromatosis: Secondary | ICD-10-CM | POA: Diagnosis not present

## 2020-01-24 DIAGNOSIS — Z9889 Other specified postprocedural states: Secondary | ICD-10-CM

## 2020-01-24 NOTE — Progress Notes (Signed)
°  Subjective:  Patient ID: Veronica Chavez, female    DOB: [birth year],  MRN: [id number]  Chief Complaint  Patient presents with   Routine Post Op    POV  DOS 11/26/2019 CORRECTION LT FOOT BUNION, CORRECTION HAMMERTOE 2,3,4 LT, EXCISION OF INTERDIGITAL NEUROMA, POSSIBLE 2ND METATARSAL SHORTENING OSTEOTOMY 2ND LT. C/o persistent swelling. New pain in dorsal/plantar/lateral midfoot and lateral ankle. Pt stated, "I tried adding an arch support to the surgical shoe. A nurse from the office told me to take it back out, so I did. I need orthotics, but I can't be measured for them with the swelling. Swelling improves at night. Wearing toe splint".   DOS: 11/26/19 Procedure: Correction Left foot, hammertoes 2,3,4, interdigital neuroma.   59 y.o. female presents with the above complaint. History confirmed with patient.  Objective:  Physical Exam: tenderness at the surgical site, local edema noted and calf supple, nontender. 1st MPJ clinicalyl stiff. Lesser MPJs with dorsal contracture reducible on manual stretch. POP about the platnar fascia centrally at the arch Incision: well healed  Assessment:   1. Hammertoe of left foot   2. Post-operative state   3. Plantar fasciitis    Plan:  Patient was evaluated and treated and all questions answered.  Post-operative State -Advised to work on both 1st and lesser toe ROM exercises and manual stretching. Continue toe alignment splint. -Transition to normal shoegear in 1 week when tolerated. -Swelling should subside soon. -Overall I think if she is able to get the toes stretched down into position her position will be adequate and she will do very well. She appears motivated to do her stretching exercises -F/u in 3 weeks   Plantar fasciitis -Plantar fascial brace displensed to rest the plantar fascia  No follow-ups on file.

## 2020-01-30 ENCOUNTER — Telehealth: Payer: Self-pay | Admitting: Podiatry

## 2020-01-30 ENCOUNTER — Encounter: Payer: Self-pay | Admitting: Podiatry

## 2020-01-30 NOTE — Telephone Encounter (Signed)
I called pt and scheduled her to see Rick 8.6 at 830am. I told pt I would send Dr Samuella Cota message to make sure ok to order. We will scan pt and put on hold until Dr Samuella Cota is back in the office and gives [place] the ok.

## 2020-01-31 ENCOUNTER — Ambulatory Visit: Payer: Managed Care, Other (non HMO) | Admitting: Orthotics

## 2020-01-31 ENCOUNTER — Other Ambulatory Visit: Payer: Self-pay

## 2020-01-31 DIAGNOSIS — M216X2 Other acquired deformities of left foot: Secondary | ICD-10-CM | POA: Diagnosis not present

## 2020-01-31 DIAGNOSIS — M2042 Other hammer toe(s) (acquired), left foot: Secondary | ICD-10-CM

## 2020-01-31 DIAGNOSIS — M21962 Unspecified acquired deformity of left lower leg: Secondary | ICD-10-CM

## 2020-01-31 DIAGNOSIS — M722 Plantar fascial fibromatosis: Secondary | ICD-10-CM

## 2020-01-31 DIAGNOSIS — M216X1 Other acquired deformities of right foot: Secondary | ICD-10-CM | POA: Diagnosis not present

## 2020-01-31 NOTE — Progress Notes (Signed)
Patient presents for casting for CMFO; she has h/o bunionectomy L, and contractures b/l.  She has a significgggggant arch which my contribue to contractures.  Discomfort is present upon palpation of stylus/cuboid on plantar surface extending sug

## 2020-02-03 ENCOUNTER — Telehealth: Payer: Self-pay | Admitting: Podiatry

## 2020-02-03 NOTE — Telephone Encounter (Signed)
Swelling will go up and down, she can stop using the brace. I think orthotics will help once she gets them

## 2020-02-03 NOTE — Telephone Encounter (Signed)
Called pt about you giving [place] the ok to order the orthotics.  She then proceeded to state her right foot is still swelling and painful to walk not at surgery site. The brace seems to make the swelling worse. What should she do?

## 2020-02-03 NOTE — Telephone Encounter (Signed)
Ok to order 

## 2020-02-06 ENCOUNTER — Encounter: Payer: Self-pay | Admitting: Podiatry

## 2020-02-07 ENCOUNTER — Encounter: Payer: Self-pay | Admitting: Podiatry

## 2020-02-07 ENCOUNTER — Other Ambulatory Visit: Payer: Self-pay

## 2020-02-07 ENCOUNTER — Ambulatory Visit (INDEPENDENT_AMBULATORY_CARE_PROVIDER_SITE_OTHER): Payer: Managed Care, Other (non HMO) | Admitting: Podiatry

## 2020-02-07 DIAGNOSIS — M7671 Peroneal tendinitis, right leg: Secondary | ICD-10-CM

## 2020-02-07 DIAGNOSIS — M25551 Pain in right hip: Secondary | ICD-10-CM

## 2020-02-07 NOTE — Progress Notes (Signed)
  Subjective:  Patient ID: Veronica Chavez, female    DOB: 10/18/1960,  MRN: [id number]  Chief Complaint  Patient presents with  . Foot Pain    pt is here for left foot pain f/u from Dr. Samuella Cota   DOS: 11/26/19 Procedure: Correction Left foot, hammertoes 2,3,4, interdigital neuroma.   59 y.o. female presents with the above complaint. History confirmed with patient.  Patient presents with a new complaint of right lateral foot pain at the fifth metatarsal base as well as along the course of the peroneal tendon.  Patient states that this started happening over the last couple of weeks while she was doing physical therapy.  She is would like to get it checked out to make sure there is nothing else going on.  She denies any other acute complaints.  Objective:  Physical Exam: tenderness at the surgical site, local edema noted and calf supple, nontender. 1st MPJ clinicalyl stiff. Lesser MPJs with dorsal contracture reducible on manual stretch. POP about the platnar fascia centrally at the arch Incision: well healed  Pain on palpation to the course of the peroneal tendon.  Pain with resisted dorsiflexion eversion.  Pain at the insertion of the peroneal tendon.  No pain at the surgical sites.  Assessment:   1. Peroneal tendinitis of right lower extremity    Plan:  Patient was evaluated and treated and all questions answered.  Peroneal tendinitis -I explained patient the etiology of peroneal tendinitis likely due to modification and ambulatory status with compensation aggravating the peroneal tendon.  Various treatment options were discussed.  I believe patient will benefit from a Tri-Lock brace to give stability of the ankle as well as allow the peroneal tendon to heal.  Per patient's pain is mild in nature therefore I feel comfortable giving her the brace as opposed to cam boot immobilization.  Patient agrees with the plan.  Post-operative State -Overall patient is doing well from the surgical site.   She does not have any acute pain in any of the surgical site.   She appears motivated to do her stretching exercises.  I have asked her to continue doing the stretching exercises per Dr. Samuella Cota.  Patient states understanding -   Plantar fasciitis -Plantar fascial brace displensed to rest the plantar fascia  No follow-ups on file.

## 2020-02-14 ENCOUNTER — Ambulatory Visit (INDEPENDENT_AMBULATORY_CARE_PROVIDER_SITE_OTHER): Payer: Managed Care, Other (non HMO) | Admitting: Podiatry

## 2020-02-14 ENCOUNTER — Ambulatory Visit (INDEPENDENT_AMBULATORY_CARE_PROVIDER_SITE_OTHER): Payer: Managed Care, Other (non HMO)

## 2020-02-14 ENCOUNTER — Other Ambulatory Visit: Payer: Self-pay

## 2020-02-14 DIAGNOSIS — M2042 Other hammer toe(s) (acquired), left foot: Secondary | ICD-10-CM | POA: Diagnosis not present

## 2020-02-21 ENCOUNTER — Other Ambulatory Visit: Payer: Managed Care, Other (non HMO) | Admitting: Orthotics

## 2020-02-27 NOTE — Progress Notes (Signed)
°  Subjective:  Patient ID: Veronica Chavez, female    DOB: 1960/09/18,  MRN: [id number]  Chief Complaint  Patient presents with   Routine Post Op    Pt states still swelling. No other concerns.   Tendonitis    Pt states left foot tendonitis still painful.   DOS: 11/26/19 Procedure: Correction Left foot, hammertoes 2,3,4, interdigital neuroma.   59 y.o. female presents with the above complaint. History confirmed with patient.  Objective:  Physical Exam: tenderness at the surgical site, local edema noted and calf supple, nontender. 1st MPJ clinicalyl stiff. Lesser MPJs with dorsal contracture reducible on manual stretch. POP about the platnar fascia centrally at the arch Incision: well healed  Assessment:   1. Hammertoe of left foot    Plan:  Patient was evaluated and treated and all questions answered.  Post-operative State -Continue normal shoegear -Continue ROM exercises.  Plantar fasciitis -Casted for CMOs.  No follow-ups on file.

## 2020-04-03 ENCOUNTER — Ambulatory Visit: Payer: Managed Care, Other (non HMO) | Admitting: Podiatry

## 2020-04-21 ENCOUNTER — Ambulatory Visit (INDEPENDENT_AMBULATORY_CARE_PROVIDER_SITE_OTHER): Payer: Managed Care, Other (non HMO) | Admitting: Podiatry

## 2020-04-21 ENCOUNTER — Other Ambulatory Visit: Payer: Self-pay

## 2020-04-21 DIAGNOSIS — Z9889 Other specified postprocedural states: Secondary | ICD-10-CM

## 2020-04-21 DIAGNOSIS — M2042 Other hammer toe(s) (acquired), left foot: Secondary | ICD-10-CM

## 2020-04-26 NOTE — Progress Notes (Signed)
  Subjective:  Patient ID: Veronica Chavez, female    DOB: [birth year],  MRN: [id number]  Chief Complaint  Patient presents with  . Follow-up    6wk f/u post orthotics. Overall pts feels great, no pain or concerns    DOS: 11/26/19 Procedure: Correction Left foot, hammertoes 2,3,4, interdigital neuroma.   59 y.o. female presents with the above complaint. History confirmed with patient. Doing very well and able to wear most shoes with orthotics denies other postop issues  Objective:  Physical Exam: tenderness at the surgical site, local edema noted and calf supple, nontender. 1st MPJ clinicalyl with good range of motion.. No further pain about the plantar fascial incision: well healed  Assessment:   1. Hammertoe of left foot   2. Post-operative state    Plan:  Patient was evaluated and treated and all questions answered.  Post-operative State -Doing very well now that she has without pain or discomfort except for occasional pains but they are improving. At this point we will check on status for second orthotic care coverage per patient request  Otherwise patient is to follow-up as needed  Return if symptoms worsen or fail to improve.

## 2020-06-11 ENCOUNTER — Encounter: Payer: Self-pay | Admitting: Podiatry

## 2020-06-12 ENCOUNTER — Telehealth: Payer: Self-pay | Admitting: Podiatry

## 2020-06-12 NOTE — Telephone Encounter (Signed)
Ok thanks 

## 2020-06-12 NOTE — Telephone Encounter (Signed)
Pt called and left message yesterday @ 938am asking about getting a second pair of orthotics ordered.  I returned the call and explained I am not sure if the 2nd pair would be covered by insurance but she could call and verify and let me know by 12.30 and it would be billed for this yr. I did give her the cpt code L3020. She is also aware that if they are not covered and she just wanted to pay for them out of pocket the cost is 219.00 within 6 months of the last pair only. She is calling the insurance and will let me know on Monday.

## 2020-06-24 ENCOUNTER — Telehealth: Payer: Self-pay | Admitting: Podiatry

## 2020-06-24 DIAGNOSIS — M216X1 Other acquired deformities of right foot: Secondary | ICD-10-CM | POA: Diagnosis not present

## 2020-06-24 DIAGNOSIS — M216X2 Other acquired deformities of left foot: Secondary | ICD-10-CM | POA: Diagnosis not present

## 2020-06-24 NOTE — Telephone Encounter (Signed)
Pt called earlier today asking for the codes for the orthotics she was calling insurance to check to see if they would cover a 2nd pr, codes were given to pt.   Pt called back and is wanting to proceed with a second pair just like the last ones  and I told pt I would give the information to Raiford Noble to order and put in charges for this yr.

## 2020-11-27 ENCOUNTER — Other Ambulatory Visit: Payer: Self-pay | Admitting: Sports Medicine

## 2020-11-27 ENCOUNTER — Ambulatory Visit
Admission: RE | Admit: 2020-11-27 | Discharge: 2020-11-27 | Disposition: A | Discharge status: Home / Self Care | Payer: Managed Care, Other (non HMO) | Source: Ambulatory Visit | Attending: Sports Medicine | Admitting: Sports Medicine

## 2020-11-27 ENCOUNTER — Other Ambulatory Visit: Payer: Self-pay

## 2020-11-27 DIAGNOSIS — M1990 Unspecified osteoarthritis, unspecified site: Secondary | ICD-10-CM

## 2021-02-17 ENCOUNTER — Telehealth: Payer: Self-pay | Admitting: Podiatry

## 2021-02-17 NOTE — Telephone Encounter (Signed)
Pt left message stating she would like to order another pair of orthotics like the last ones.  I returned call and asked pt if we are to bill insurance if so she would need and appt to be seen for an updated office note to file with the insurance. She wanted to check with the insurance and will let me know tomorrow. I did give her the cpt code L3020 and dx codes m21.6x1/M21.6x2

## 2021-02-18 ENCOUNTER — Telehealth: Payer: Self-pay | Admitting: Podiatry

## 2021-02-18 NOTE — Telephone Encounter (Signed)
Received voicemail from Meno a care coordinator for [employer] and she stated it was urgent for me to call her back. @ [phone] and tell them case # [id number]..  I returned call and talked to [facility] and she was asking for a rx and notes so they could find a vendor to get pt orthotics.  I then told her that we actually order the orthotics and bill thru our office. We have gotten pt 2 pairs last year and already have the mold to order an additional pair as to that is what pt called me about yesterday. Toni Amend stated she is not sure why they got involved but I did give her the cpt L3020 and dx codes M21.6x1/M21.6x2 and she is going to call member services to see if they would be covered for pt. Toni Amend is to call me back tomorrow.

## 2021-02-19 ENCOUNTER — Telehealth: Payer: Self-pay | Admitting: Podiatry

## 2021-02-19 DIAGNOSIS — M216X1 Other acquired deformities of right foot: Secondary | ICD-10-CM | POA: Diagnosis not present

## 2021-02-19 DIAGNOSIS — M216X2 Other acquired deformities of left foot: Secondary | ICD-10-CM | POA: Diagnosis not present

## 2021-02-19 NOTE — Telephone Encounter (Signed)
Pt called and stated she talked with her insurance and the orthotics are covered and she wants to proceed with ordering another pair. I told pt I would get them ordered and call her when they come in.

## 2021-03-11 ENCOUNTER — Telehealth: Payer: Self-pay | Admitting: Podiatry

## 2021-03-11 NOTE — Telephone Encounter (Signed)
Orthotics in.. pt aware ok to pick up and of balance. She may set up payment plan.150.00 down

## 2021-09-08 ENCOUNTER — Other Ambulatory Visit: Payer: Managed Care, Other (non HMO)

## 2021-09-10 ENCOUNTER — Telehealth: Payer: Self-pay

## 2021-09-10 NOTE — Telephone Encounter (Signed)
Received phonecall asking [place] to call back and schedule her for pickup. Forwarded to scheduling ?

## 2022-09-07 IMAGING — CR DG KNEE 3 VIEWS*L*
3 series · 3 of 3 positions shown · non-contrast
Comparison: None.

CLINICAL DATA: Osteoarthritis.  Pain.

EXAM:
LEFT KNEE - 3 VIEW

[w knee ap left]
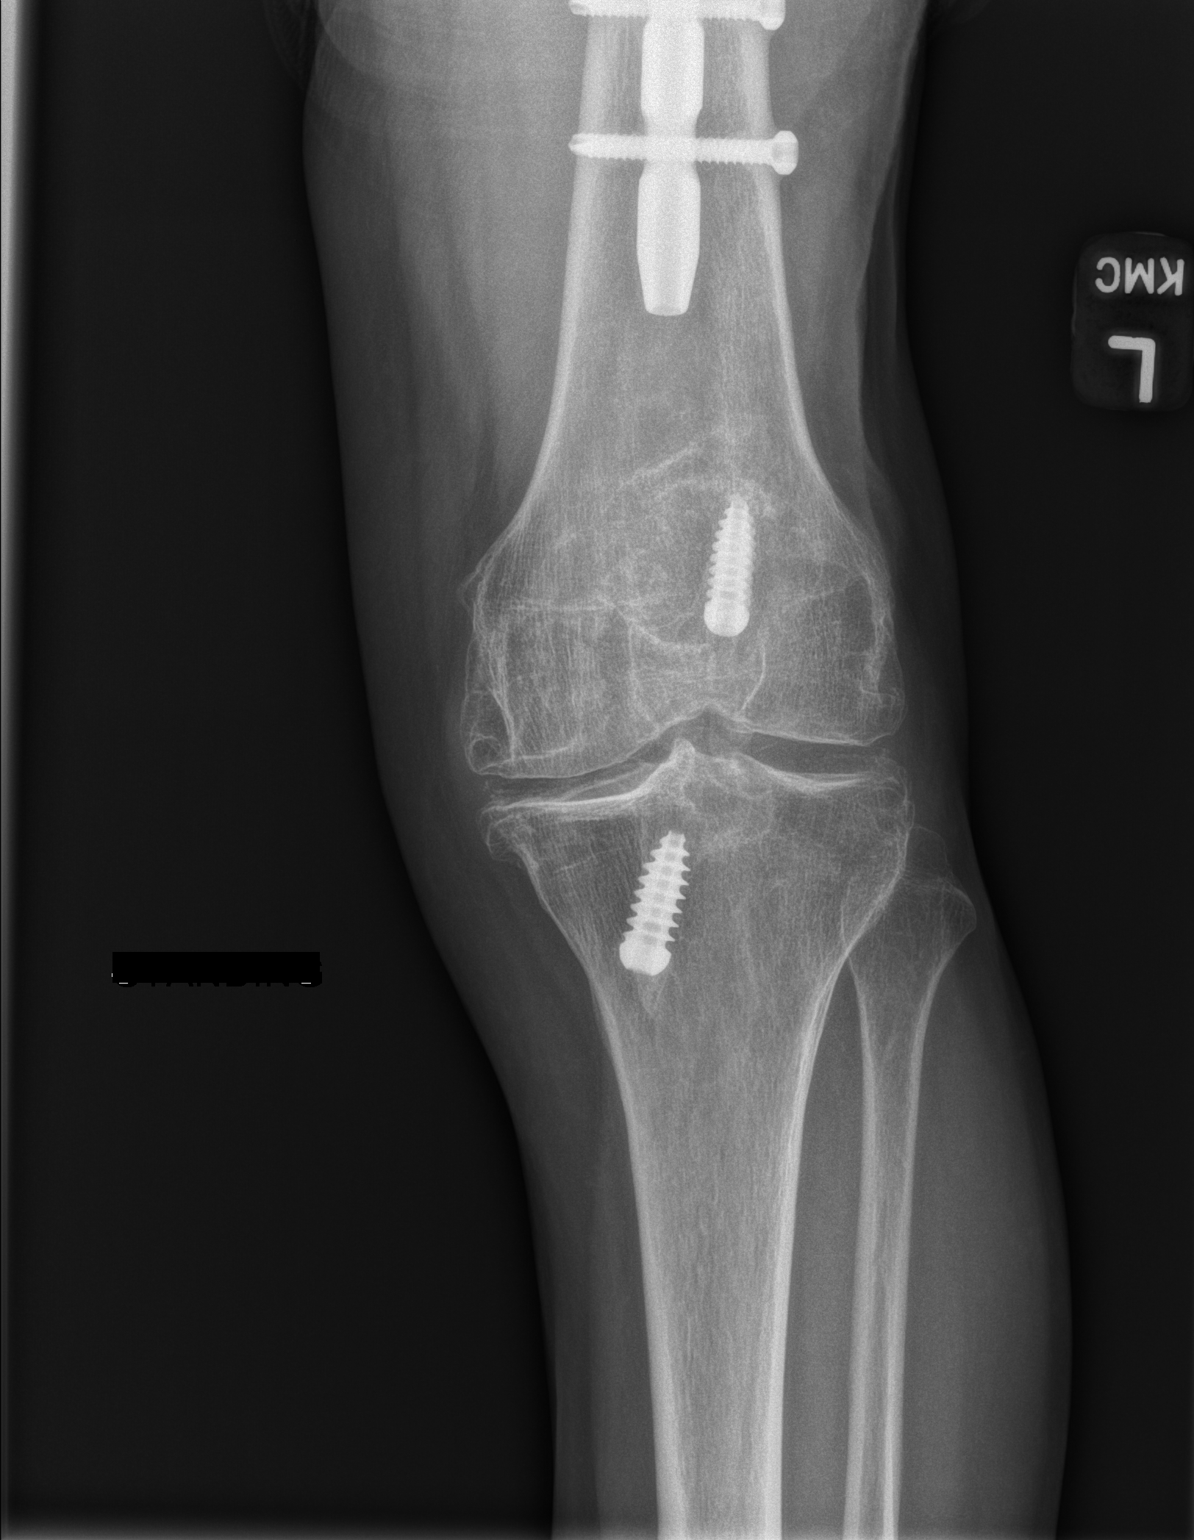

[w knee lat left]
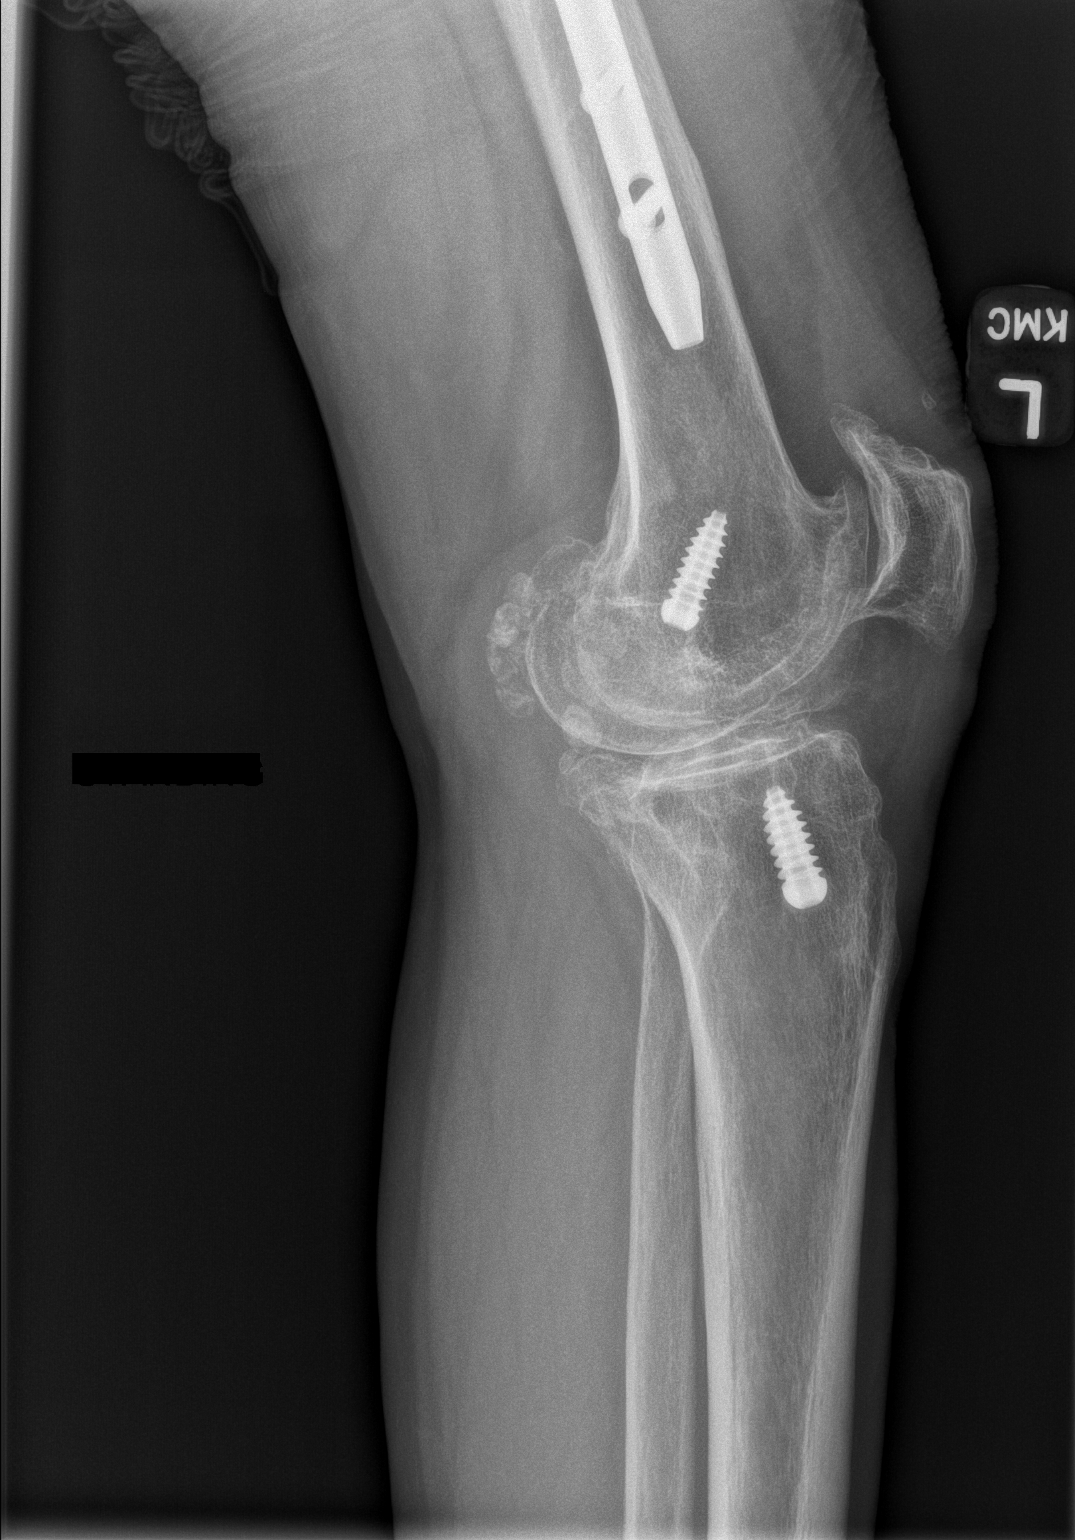

[x knee sunrise left]
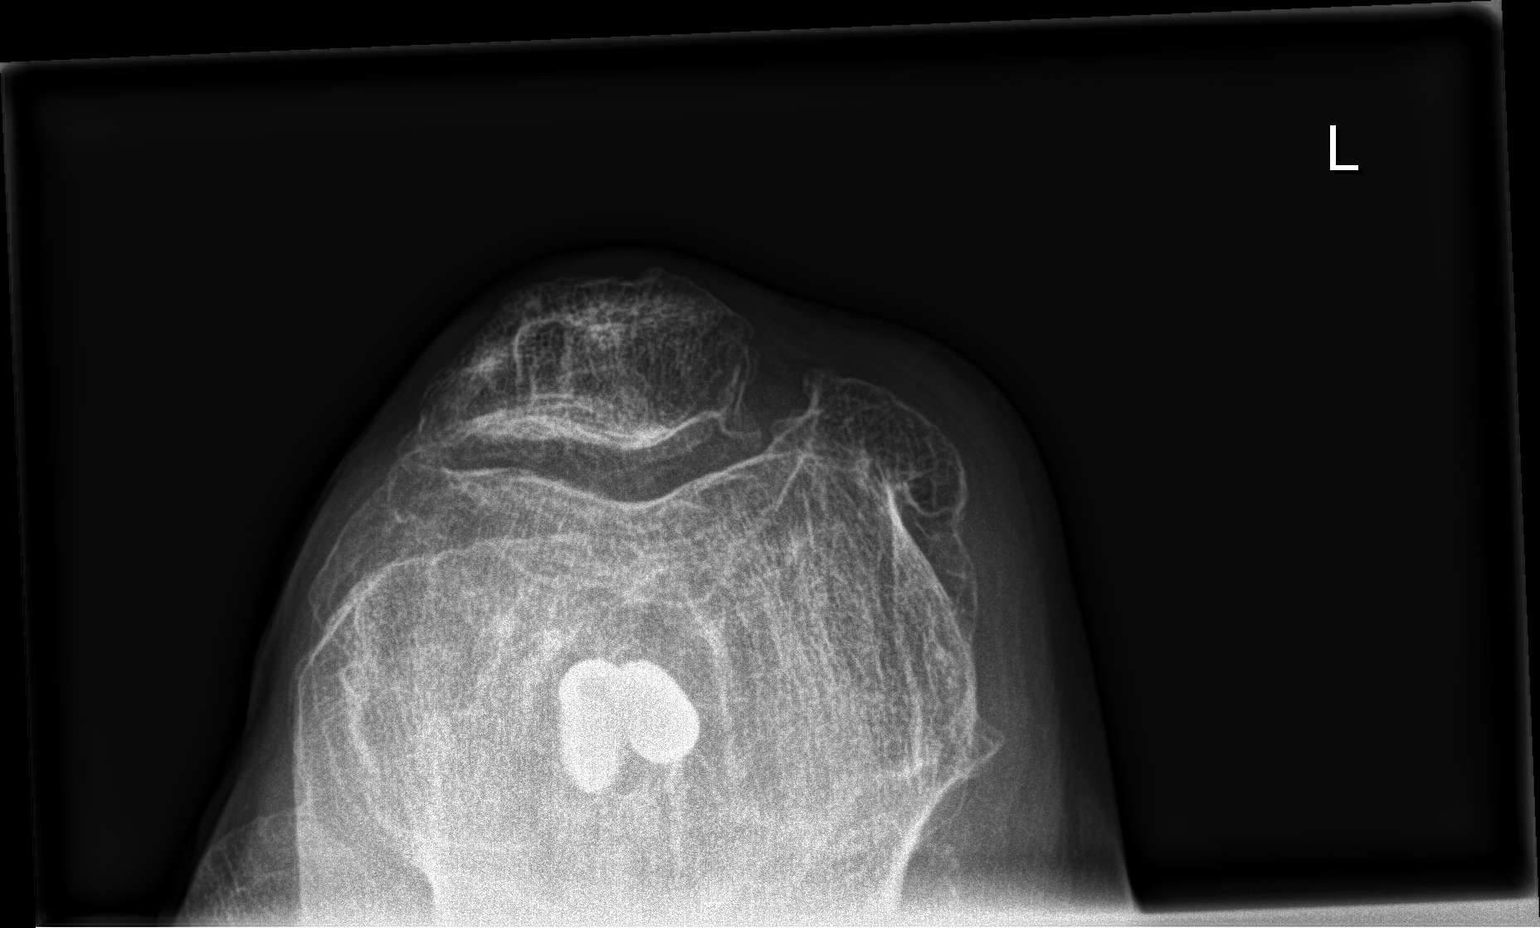

[3 of 3 positions shown; findings below may reference images not displayed]

FINDINGS: Surgical hardware associated with ACL repair identified. An
intramedullary rod is seen in the distal femur with at least 2
distal interlocking screws.

No fracture or dislocation. Tricompartmental degenerative changes
most prominent medially and in the patellofemoral compartments.
Loose bodies in the posterior joint. No fracture or effusion. No
other abnormalities.
IMPRESSION: Tricompartmental degenerative changes most prominent medially and in
the patellofemoral compartment. Loose bodies in the posterior joint.
